# Patient Record
Sex: Female | Born: 1960
Health system: Southern US, Community
[De-identification: ages and names within clinical notes are randomized; demographics above are authoritative.]

## PROBLEM LIST (undated history)

## (undated) DIAGNOSIS — E039 Hypothyroidism, unspecified: Secondary | ICD-10-CM

## (undated) DIAGNOSIS — E559 Vitamin D deficiency, unspecified: Secondary | ICD-10-CM

## (undated) DIAGNOSIS — E785 Hyperlipidemia, unspecified: Secondary | ICD-10-CM

## (undated) DIAGNOSIS — J302 Other seasonal allergic rhinitis: Secondary | ICD-10-CM

## (undated) DIAGNOSIS — E2839 Other primary ovarian failure: Secondary | ICD-10-CM

## (undated) DIAGNOSIS — E079 Disorder of thyroid, unspecified: Secondary | ICD-10-CM

## (undated) HISTORY — DX: Other seasonal allergic rhinitis: J30.2

## (undated) HISTORY — DX: Hyperlipidemia, unspecified: E78.5

---
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
## 1986-03-05 HISTORY — PX: TMJ ARTHROPLASTY: SHX1066

## 1997-06-25 ENCOUNTER — Ambulatory Visit (HOSPITAL_COMMUNITY): Admission: RE | Admit: 1997-06-25 | Discharge: 1997-06-25 | Payer: Self-pay | Admitting: Obstetrics and Gynecology

## 1998-03-05 HISTORY — PX: ABDOMINAL HYSTERECTOMY: SHX81

## 1999-01-13 ENCOUNTER — Emergency Department (HOSPITAL_COMMUNITY): Admission: EM | Admit: 1999-01-13 | Discharge: 1999-01-13 | Payer: Self-pay | Admitting: Emergency Medicine

## 1999-01-24 ENCOUNTER — Ambulatory Visit (HOSPITAL_COMMUNITY): Admission: RE | Admit: 1999-01-24 | Discharge: 1999-01-24 | Payer: Self-pay | Admitting: Urology

## 1999-01-24 ENCOUNTER — Encounter: Payer: Self-pay | Admitting: Urology

## 1999-03-13 ENCOUNTER — Other Ambulatory Visit: Admission: RE | Admit: 1999-03-13 | Discharge: 1999-03-13 | Payer: Self-pay | Admitting: Obstetrics and Gynecology

## 2000-05-08 ENCOUNTER — Other Ambulatory Visit: Admission: RE | Admit: 2000-05-08 | Discharge: 2000-05-08 | Payer: Self-pay | Admitting: Obstetrics and Gynecology

## 2000-07-09 ENCOUNTER — Encounter (INDEPENDENT_AMBULATORY_CARE_PROVIDER_SITE_OTHER): Payer: Self-pay

## 2000-07-09 ENCOUNTER — Ambulatory Visit (HOSPITAL_COMMUNITY): Admission: RE | Admit: 2000-07-09 | Discharge: 2000-07-09 | Payer: Self-pay | Admitting: Obstetrics and Gynecology

## 2001-02-18 ENCOUNTER — Emergency Department (HOSPITAL_COMMUNITY): Admission: EM | Admit: 2001-02-18 | Discharge: 2001-02-18 | Payer: Self-pay | Admitting: Emergency Medicine

## 2001-10-15 ENCOUNTER — Other Ambulatory Visit: Admission: RE | Admit: 2001-10-15 | Discharge: 2001-10-15 | Payer: Self-pay | Admitting: Obstetrics and Gynecology

## 2001-10-20 ENCOUNTER — Encounter: Admission: RE | Admit: 2001-10-20 | Discharge: 2001-10-20 | Payer: Self-pay | Admitting: Obstetrics and Gynecology

## 2001-10-20 ENCOUNTER — Encounter: Payer: Self-pay | Admitting: Obstetrics and Gynecology

## 2002-07-28 ENCOUNTER — Encounter (INDEPENDENT_AMBULATORY_CARE_PROVIDER_SITE_OTHER): Payer: Self-pay | Admitting: *Deleted

## 2002-07-28 ENCOUNTER — Inpatient Hospital Stay (HOSPITAL_COMMUNITY): Admission: RE | Admit: 2002-07-28 | Discharge: 2002-07-30 | Payer: Self-pay | Admitting: Obstetrics and Gynecology

## 2003-09-01 ENCOUNTER — Other Ambulatory Visit: Admission: RE | Admit: 2003-09-01 | Discharge: 2003-09-01 | Payer: Self-pay | Admitting: Obstetrics and Gynecology

## 2003-10-29 ENCOUNTER — Emergency Department (HOSPITAL_COMMUNITY): Admission: EM | Admit: 2003-10-29 | Discharge: 2003-10-29 | Payer: Self-pay | Admitting: Emergency Medicine

## 2005-02-21 ENCOUNTER — Other Ambulatory Visit: Admission: RE | Admit: 2005-02-21 | Discharge: 2005-02-21 | Payer: Self-pay | Admitting: Obstetrics and Gynecology

## 2006-02-11 ENCOUNTER — Encounter: Admission: RE | Admit: 2006-02-11 | Discharge: 2006-02-11 | Payer: Self-pay | Admitting: Obstetrics and Gynecology

## 2006-04-05 ENCOUNTER — Encounter: Payer: Self-pay | Admitting: Cardiology

## 2006-04-05 ENCOUNTER — Ambulatory Visit (HOSPITAL_COMMUNITY): Admission: RE | Admit: 2006-04-05 | Discharge: 2006-04-05 | Payer: Self-pay | Admitting: Obstetrics and Gynecology

## 2007-04-30 ENCOUNTER — Encounter: Payer: Self-pay | Admitting: Cardiology

## 2008-08-27 ENCOUNTER — Encounter: Admission: RE | Admit: 2008-08-27 | Discharge: 2008-08-27 | Payer: Self-pay | Admitting: Obstetrics and Gynecology

## 2009-07-21 ENCOUNTER — Encounter: Payer: Self-pay | Admitting: Cardiology

## 2009-07-27 ENCOUNTER — Encounter: Payer: Self-pay | Admitting: Cardiology

## 2009-09-08 DIAGNOSIS — E785 Hyperlipidemia, unspecified: Secondary | ICD-10-CM

## 2009-09-13 ENCOUNTER — Ambulatory Visit: Payer: Self-pay | Admitting: Cardiology

## 2010-03-26 ENCOUNTER — Encounter: Payer: Self-pay | Admitting: Obstetrics and Gynecology

## 2010-04-04 NOTE — Assessment & Plan Note (Signed)
Summary: np6/hyperlipidema/jss   Visit Type:  new pt visit Referring Provider:  Dr. Henderson Cloud Primary Provider:  Dr. Henderson Cloud  CC:  pt referred to Dr. Daleen Squibb for hyperlipidemia...no cardiac complaints today.  History of Present Illness: Ms Rupert is a 50 year old white female referred today by Dr Henderson Cloud for progressive mixed hyperlipidemia.she  She currently denies any symptoms of angina or ischemia. She exercises on an inconsistent basis. Her risk factors for coronary disease include hyperlipidemia with her last total cholesterol being 259, triglycerides 187, HDL 55, LDL 167. Her total cholesterol to HDL ratio is 4.7. Looking back through her lipid panels today, which I have reviewed with her, there has been a progressive increase her total cholesterol, triglycerides, LDL, also her HDL. During this time frame, she has begun fish oil. She has also gained weight.  Current Medications (verified): 1)  Fish Oil Triple Strength 1400 Mg Caps (Omega-3 Fatty Acids) .Marland Kitchen.. 1 Cap Two Times A Day 2)  Multivitamins   Tabs (Multiple Vitamin) .Marland Kitchen.. 1 Tab Once Daily 3)  Aspirin 81 Mg Tbec (Aspirin) .... Take One Tablet By Mouth Daily 4)  Natural Psyllium Fiber 58.6 % Powd (Psyllium) .... Take As Directed  Allergies (verified): No Known Drug Allergies  Past History:  Past Medical History: Last updated: 09/08/2009 Mixed hyperlipidemia  Past Surgical History: Last updated: 09/08/2009 C-section x 2 TMJ surgery Oral surgery Hysterectomy  Family History: Last updated: 09/08/2009 Family History of Coronary Artery Disease:  Family History of Asthma Family History of IBS  Social History: Last updated: 09/08/2009 Divorced  Tobacco Use - No.  Alcohol Use - yes Drug Use - no  Risk Factors: Smoking Status: never (09/08/2009)  Review of Systems       negative other than history of present illness  Vital Signs:  Patient profile:   50 year old female Height:      63 inches Weight:      165  pounds BMI:     29.33 Pulse rate:   66 / minute Pulse rhythm:   regular BP sitting:   102 / 70  (left arm) Cuff size:   large  Vitals Entered By: Danielle Rankin, CMA (September 13, 2009 9:59 AM)  Physical Exam  General:  no acute distress, overweight Head:  normocephalic and atraumatic Eyes:  PERRLA/EOM intact; conjunctiva and lids normal. Neck:  Neck supple, no JVD. No masses, thyromegaly or abnormal cervical nodes. Chest Hamzeh Tall:  no deformities or breast masses noted Lungs:  Clear bilaterally to auscultation and percussion. Heart:  Non-displaced PMI, chest non-tender; regular rate and rhythm, S1, S2 without murmurs, rubs or gallops. Carotid upstroke normal, no bruit. Normal abdominal aortic size, no bruits. Femorals normal pulses, no bruits. Pedals normal pulses. No edema, no varicosities. Abdomen:  Bowel sounds positive; abdomen soft and non-tender without masses, organomegaly, or hernias noted. No hepatosplenomegaly. Msk:  Back normal, normal gait. Muscle strength and tone normal. Pulses:  pulses normal in all 4 extremities Extremities:  No clubbing or cyanosis. Neurologic:  Alert and oriented x 3. Skin:  Intact without lesions or rashes. Psych:  Normal affect.   EKG  Procedure date:  09/13/2009  Findings:      normal sinus rhythm, normal EKG  EKG  Procedure date:  09/13/2009  Findings:      normal sinus rhythm, normal EKG  Impression & Recommendations:  Problem # 1:  HYPERLIPIDEMIA-MIXED (ICD-272.4) Assessment Deteriorated I had a long talk with her today. She has a low risk of premature coronary disease or  vascular disease based on her risk analysis score. She is adverse to taking a statin. Her father had all kinds of problems with muscle aches and weakness.  After a long discussion, we have decided on a conservative approach. The fish oil has helped her HDL and probably kept her triglycerides down as well. We've advised her to continue this. In addition advise weight  reduction and 3 hours of cardio activity each week. We've also reviewed the symptoms of angina or ischemic heart disease. She has been advised how to respond to both exertional angina or acute coronary syndrome. I will see her back on a p.r.n. basis. If she would like to consider a statin down the road, I told her I be glad to advise her and initiate one. Current guidelines would not support this.  Other Orders: EKG w/ Interpretation (93000)  Patient Instructions: 1)  Your physician recommends that you schedule a follow-up appointment in: as needed 2)  Your physician recommends that you continue on your current medications as directed. Please refer to the Current Medication list given to you today.

## 2010-04-04 NOTE — Letter (Signed)
Summary: [facility]  [facility]   Imported By: Marylou Mccoy 09/29/2009 09:57:57  _____________________________________________________________________  External Attachment:    Type:   Image     Comment:   External Document

## 2010-07-21 NOTE — Op Note (Signed)
[facility]  Patient:    [name], [name]                      MRN: [id number] Proc. Date: 07/09/00 Adm. Date:  [id number] Attending:  Cordelia Pen Ii                           Operative Report  PREOPERATIVE DIAGNOSIS:         Menometrorrhagia.  POSTOPERATIVE DIAGNOSIS:        Endometrial polyp.  OPERATION:                      Hysteroscopy with resection of endometrial polyp and sharp curettage.  SURGEON:                        Guy Sandifer. Arleta Creek, M.D.  ANESTHESIA:                     General LMA.  ANESTHESIOLOGIST:               Octaviano Glow. Pamalee Leyden, M.D.  ESTIMATED BLOOD LOSS:           Less than 100 cc.  INPUT AND OUTPUT:               Sorbitol distending media, 60 cc deficit.  INDICATIONS AND CONSENT:        This patient is a 50 year old married white female, G2, P2, status post tubal ligation with menometrorrhagia.  Details are dictated in the history and physical.  Hysteroscopy with resectoscope and D&C is discussed with the patient.  Potential complications are discussed including but not limited to infection, uterine perforation, organ damage, bleeding requiring transfusion of blood products with possible transfusion reaction, HIV and hepatitis acquisition, DVT, PE and pneumonia.  All questions are answered and consent is signed on the chart.  FINDINGS:                       There is a single 2-3 cm polyp arising from the anterior superior wall of the endometrial cavity slightly to the right of midline.  No other abnormal structures are noted.  Fallopian tube ostia are identified bilaterally.  DESCRIPTION OF PROCEDURE:       The patient was taken to the operating room and placed in the dorsal supine position where general anesthesia is introduced via LMA.  She was then placed in the dorsal lithotomy position where she was prepped, bladder straight catheterized, and she was draped in a sterile fashion.  Examination revealed the  uterus to be anteverted, 8-10 weeks in size.  Bivalve speculum was placed in the vagina, and the anterior cervical lip was grasped with a single-tooth tenaculum.  The cervix was then gently and progressively dilated to a 35 Pratt dilator.  The resectoscope was then placed in the cervical canal and advanced under direct visualization using sorbitol distending media.  The above findings were noted.  The polyp is resected with the single right angle loop.  The polyp is totally resected.  No other abnormal structures were noted.  Hysteroscope was withdrawn.  Sharp curettage was carried out, and these curettings were sent separately to pathology.  Good hemostasis is noted.  All instruments are removed.  The patient was awakened and taken to the recovery room in stable condition. DD:  07/09/00 TD:  07/09/00 Job: [id number]

## 2010-07-21 NOTE — Op Note (Signed)
[name], [name]                         ACCOUNT NO.:  [id number]   MEDICAL RECORD NO.:  [id number]                   PATIENT TYPE:  INP   LOCATION:  9305                                 FACILITY:  [facility]   PHYSICIAN:  Guy Sandifer. Arleta Creek, M.D.           DATE OF BIRTH:  1960/05/19   DATE OF PROCEDURE:  07/28/2002  DATE OF DISCHARGE:                                 OPERATIVE REPORT   PREOPERATIVE DIAGNOSIS:  Uterine leiomyomata.   POSTOPERATIVE DIAGNOSIS:  Leiomyomata.   PROCEDURE:  Total abdominal hysterectomy.   SURGEON:  Guy Sandifer. Henderson Cloud, M.D.   ASSISTANTFreddy Finner, M.D.   ANESTHESIA:  General with endotracheal intubation.   ESTIMATED BLOOD LOSS:  200 mL.   SPECIMENS:  Uterus, 272 g.   INDICATIONS AND CONSENT:  The patient is a 50 year old married white female,  G2, P2, status post tubal ligation, with enlarging symptomatic uterine  leiomyomata.  Details dictated in the history and physical.  Total abdominal  hysterectomy and removal of one tube or ovary only if distinctly abnormal is  discussed.  Potential risks and complications have been reviewed  preoperatively, including but not limited to infection, bowel, bladder, or  ureteral damage, bleeding requiring transfusion of blood products with  possible HIV and hepatitis acquisition, DVT, PE, pneumonia, fistula  formation, and postoperative dyspareunia.  All questions have been answered,  and consent is signed on the chart.   FINDINGS:  The uterus is approximately 10 weeks in size and distorted with  multiple intramural uterine leiomyomata.  Tubes and ovaries are normal  bilaterally, status post tubal ligation with Filshie clips bilaterally.  There is some scarring with the vesicouterine peritoneum adherent to the  lower anterior uterine segment.  Posterior cul-de-sac is normal.   DESCRIPTION OF PROCEDURE:  The patient is taken to the operating room and  placed in the dorsal supine position, where general  anesthesia is induced  via endotracheal intubation.  She is then prepped abdominally and vaginally,  a Foley catheter is placed, and the bladder is drained and she is draped in  a sterile fashion.  The skin is entered through the previous Pfannenstiel  scar, and dissection is carried out in layers to the peritoneum.  The  peritoneum is entered and extended superiorly and inferiorly.  The Lenox Ahr retractor is placed, the bladder blade is placed, and the bowel  is packed away and the upper blade is placed.  Careful inspection reveals no  undue pressure on the pelvic rim with the retracting blades.  Kelly clamps  are placed on the proximal ligaments bilaterally to elevate the uterus.  The  left round ligament is ligated with 0 Monocryl suture.  All suture will be 0  Monocryl unless otherwise designated.  Round ligaments then taken down and  the anterior leaf of the broad ligament is taken down sharply.  Posteriorly  this is bluntly perforated.  The proximal ligaments are then taken down and  doubly ligated, first with a free tie, then with a suture.  The bladder is  advanced sufficiently and the left uterine vessels are skeletonized.  The  uterine vessels are then taken down and singly ligated.  A similar procedure  is carried out on the right side.  The adhesions of the vesicouterine  peritoneum on the anterior fundus are taken down sharply and then bluntly  without difficulty.  The right uterine vessels are taken down as well.  The  cardinal ligament is then taken down with successive bites with a straight  Heaney clamp.  A curved clamp is placed around the right vaginal fornix and  uterosacral ligament.  This pedicle successfully enters the vagina.  This is  ligated with a transfixion suture and held.  A similar procedure is carried  out on the left side.  The specimen is then cut free from the vagina and the  inspection reveals the total cervix has been removed.  The cuff is  then  closed with figure-of-eights.  The uterosacral ligaments are ligated in the  midline with a separate suture.  Copious irrigation is carried out and  several small bleeders on the peritoneal edges are controlled with cautery.  The right ovary has been, in fact, suspended from the right round ligament  during the initial dissection.  The left ovary is suspended from the left  round ligament with a separate suture.  Reinspection reveals good hemostasis  all around.  The packs are removed.  The anterior peritoneum was closed in a  running fashion with 0 Monocryl suture, which is also used to reapproximate  the rectus and pyramidalis muscles in the midline.  The anterior rectus  fascia is closed in a running fashion starting at each angle and meeting in  the middle with 0 PDS suture.  Skin is closed with clips.  All sponge,  instrument, and needle counts are correct.  The patient is awakened and  taken to the recovery room in stable condition.                                               Guy Sandifer Arleta Creek, M.D.    JET/MEDQ  D:  07/28/2002  T:  07/28/2002  Job:  [id number]

## 2010-07-21 NOTE — H&P (Signed)
[facility]  Patient:    [name], [name]                        MRN: [id number] Adm. Date:  07/09/00 Attending:  Guy Sandifer. Arleta Creek, M.D.                         History and Physical  CHIEF COMPLAINT:                Menometrorrhagia.  HISTORY OF PRESENT ILLNESS:     This patient is a 49 year old married white female G2, P2, status post tubal ligation with menstrual cycles essentially every two weeks.  On physical examination, uterus is 8-10 weeks in size which is a slight growth over the past year.  On ultrasound, the uterus measures 9.2 x 5 x 5.7 cm with at least two leiomyomata present, the largest being 3.3 cm. The ovaries are normal.  On sonohysterogram there is a 2.3 cm polypoid type structure projecting into the endometrial cavity.  After discussion of options and management, hysteroscopy with resectoscope and D&C is discussed.  All questions have been answered.  PAST MEDICAL HISTORY:           None.  PAST SURGICAL HISTORY:          1. Appendectomy in 1977.                                 2. Temporomandibular joint surgery.                                 3. Tubal ligation 1999.  OBSTETRICAL HISTORY:            Cesarean section x 2.  FAMILY HISTORY:                 Diabetes in first cousin.  Coronary artery disease in paternal grandfather and maternal grandfather.  MEDICATIONS:                    Xanax 0.25 mg p.o. b.i.d. p.r.n.  ALLERGIES:                      No known drug allergies.  SOCIAL HISTORY:                 The patient denies tobacco, alcohol or drug abuse.  PHYSICAL EXAMINATION:  VITAL SIGNS:                    Height 5 feet 3 inches, weight 148 pounds, blood pressure 96/64.  HEENT:                          Without thyromegaly.  LUNGS:                          Clear to auscultation.  HEART:                          Regular rate and rhythm.  BACK:                           Without CVA tenderness.  BREASTS:  No masses, retractions or discharge.  ABDOMEN:                        Soft, nontender without masses.  PELVIC:                         Vulva, vagina and cervix without lesions.  The uterus is anteverted 8-10 weeks in size, irregular in contour, nontender. Adnexa nontender without masses.  EXTREMITIES:                    Grossly within normal limits.  NEUROLOGICAL:                   Grossly within normal limits.  ASSESSMENT:                     Menometrorrhagia.  PLAN:                           Hysteroscopy with D&C. DD:  07/04/00 TD:  07/04/00 Job: [id number]

## 2010-07-21 NOTE — Discharge Summary (Signed)
   NAMEROCHELLE, LARUE                         ACCOUNT NO.:  [id number]   MEDICAL RECORD NO.:  [id number]                   PATIENT TYPE:  INP   LOCATION:  9305                                 FACILITY:  [facility]   PHYSICIAN:  Guy Sandifer. Arleta Creek, M.D.           DATE OF BIRTH:  August 26, 1960   DATE OF ADMISSION:  07/28/2002  DATE OF DISCHARGE:  07/30/2002                                 DISCHARGE SUMMARY   ADMISSION DIAGNOSIS:  Uterine myomata   DISCHARGE DIAGNOSIS:  Uterine myomata.   PROCEDURE:  On 07/28/2002, total abdominal hysterectomy.   REASON FOR ADMISSION:  This patient is a 50 year old married white female,  G2, P2, status post tubal ligation with increasing symptomatic uterine  myomata.  Details are dictated in History and Physical.   HOSPITAL COURSE:  The patient was admitted to the hospital and underwent the  above procedure.  Estimated blood loss is 200 ml.  On the evening of  surgery, she has good pain relief.  She had some nausea when she dangling  her legs but okay when in the bed.  Vital signs are stable.  She is afebrile  with good urine output.   On the first postoperative day, she is not yet passing flatus and having  some low back pain.  She is voiding well and up in a chair.  She remains  afebrile with stable vital signs.  Abdomen is soft with positive bowel  sounds.  White count 7.6, hemoglobin 11.4.   On the day of discharge, she is passing flatus after a Dulcolax suppository.  Tolerating a regular diet, ambulating, and voiding without problems.   CONDITION ON DISCHARGE:  Good.   DIET:  Regular as tolerated.   ACTIVITY:  No lifting, operation of an automobile,  no vaginal entry.   FOLLOW UP:  She is to call the office for problems including but not limited  to temperature of 101 or greater, heavy vaginal bleeding, increasing pain,  persistent nausea or vomiting.  Follow up in the office in two weeks.   DISCHARGE MEDICATIONS:  1. Ibuprofen 600 mg p.o.  q.6h. p.r.n.  2. Percocet 5/325 mg, #30, 1 to 2 p.o. q.6h. p.r.n.  3. Multivitamins daily.                                               Guy Sandifer Arleta Creek, M.D.    JET/MEDQ  D:  07/30/2002  T:  07/30/2002  Job:  [id number]

## 2010-07-21 NOTE — H&P (Signed)
[name], [name]                         ACCOUNT NO.:  [id number]   MEDICAL RECORD NO.:  [id number]                   PATIENT TYPE:  INP   LOCATION:  NA                                   [facility]   PHYSICIAN:  Guy Sandifer. Arleta Creek, M.D.           DATE OF BIRTH:  1961-02-28   DATE OF ADMISSION:  07/28/2002  DATE OF DISCHARGE:                                HISTORY & PHYSICAL   CHIEF COMPLAINT:  Uterine fibroids.   HISTORY OF PRESENT ILLNESS:  This patient is a 50 year old, married, white  female, G2, P2, status post tubal ligation with enlarging symptomatic  uterine myelomata.  An ultrasound on Jul 08, 2002, confirms growth of the  uterine leiomyomata.  The uterus measures 12 x 8 x 6.5 cm and there are  multiple leiomyomata ranging from 2-4.5 cm.  The ovaries appeared normal.  After discussion of the options, the patient is being admitted for total  abdominal hysterectomy and removal of one tube and ovary only if distinctly  abnormal.   PAST MEDICAL HISTORY:  Negative.   PAST SURGICAL HISTORY:  1. Appendectomy in 1977.  2. TMJ surgery.  3. Tubal ligation in 1994.  4. Hysteroscopy and D&C for benign endometrial polyp in 2002.   OBSTETRICAL HISTORY:  Cesarean section x 2.   FAMILY HISTORY:  Diabetes in first cousin.  Coronary artery disease in  paternal grandfather and maternal grandfather.   MEDICATIONS:  1. Multivitamins daily.  2. Seasonal allergy medicine p.r.n.   ALLERGIES:  No known drug allergies.   SOCIAL HISTORY:  The patient denies tobacco, alcohol, or drug abuse.   REVIEW OF SYSTEMS:  NEUROLOGIC:  Denies headaches.  PULMONARY:  Denies  shortness of breath or cough.  CARDIOVASCULAR:  Denies chest pain.  GASTROINTESTINAL:  Denies abdominal pain.  MUSCULOSKELETAL:  Denies joint  pains.   PHYSICAL EXAMINATION:  HEIGHT:  5 feet 3 inches.  WEIGHT:  161 pounds.  VITAL SIGNS:  Blood pressure 100/66.  NECK:  Without thyromegaly.  LUNGS:  Clear to  auscultation.  HEART:  Regular rate and rhythm.  BACK:  Without CVA tenderness.  BREASTS:  With fibrocystic change bilaterally.  ABDOMEN:  Soft and nontender without masses.  PELVIC:  Vulva, vagina, and cervix without lesions.  The uterus is about 10  weeks in size, irregular in contour, mobile, and mildly tender.  Adnexal  exam compromised.  EXTREMITIES:  Grossly within normal limits.  NEUROLOGIC:  Grossly within normal limits.   ASSESSMENT:  Symptomatic uterine leiomyomata.   PLAN:  1. Total abdominal hysterectomy.  2. Removal of one tube and ovary only if abnormal.                                               Guy Sandifer. Tomblin II,  M.D.    JET/MEDQ  D:  07/27/2002  T:  07/27/2002  Job:  [id number]

## 2011-01-31 ENCOUNTER — Other Ambulatory Visit: Payer: Self-pay | Admitting: Obstetrics and Gynecology

## 2011-01-31 DIAGNOSIS — N63 Unspecified lump in unspecified breast: Secondary | ICD-10-CM

## 2011-02-02 ENCOUNTER — Ambulatory Visit
Admission: RE | Admit: 2011-02-02 | Discharge: 2011-02-02 | Disposition: A | Discharge status: Home / Self Care | Payer: 59 | Source: Ambulatory Visit | Attending: Obstetrics and Gynecology | Admitting: Obstetrics and Gynecology

## 2011-02-02 ENCOUNTER — Other Ambulatory Visit: Payer: Self-pay | Admitting: Obstetrics and Gynecology

## 2011-02-02 DIAGNOSIS — N63 Unspecified lump in unspecified breast: Secondary | ICD-10-CM

## 2011-02-14 ENCOUNTER — Other Ambulatory Visit: Payer: Self-pay

## 2011-06-10 ENCOUNTER — Ambulatory Visit (INDEPENDENT_AMBULATORY_CARE_PROVIDER_SITE_OTHER): Payer: 59 | Admitting: Family Medicine

## 2011-06-10 VITALS — BP 112/73 | HR 67 | Temp 98.6°F | Resp 16 | Ht 63.5 in | Wt 170.2 lb

## 2011-06-10 DIAGNOSIS — J3489 Other specified disorders of nose and nasal sinuses: Secondary | ICD-10-CM

## 2011-06-10 DIAGNOSIS — M436 Torticollis: Secondary | ICD-10-CM

## 2011-06-10 DIAGNOSIS — R0981 Nasal congestion: Secondary | ICD-10-CM

## 2011-06-10 MED ORDER — PREDNISONE 20 MG PO TABS: ORAL_TABLET | ORAL | Status: DC

## 2011-06-10 MED ORDER — CYCLOBENZAPRINE HCL 5 MG PO TABS: ORAL_TABLET | ORAL | Status: DC

## 2011-06-10 NOTE — Progress Notes (Signed)
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  No weakness, numbness.  Pain with rotating head.   Also notes some sinus congestion lately.  No definite h/o seasonal allergies.  Uses Nettipot  O:  Alert, being careful not to move neck freely Tender at origin and insertion of the levator scapula muscle Decreased ROM of neck with respect to rotation limitation secondary to pain Reflexes in arms normal, motor and sensory also normal HEENT:  Mild nasal congestion and swelling Chest:  Clear Heart:  Reg, no murmur  A:  Torticollis, mild sinus congestion  P:  Prednisone 20 2 daily x 3, flexeril 5 at hs.

## 2011-06-10 NOTE — Patient Instructions (Signed)
Torticollis, Acute You have suddenly (acutely) developed a twisted neck (torticollis). This is usually a self-limited condition. CAUSES  Acute torticollis may be caused by malposition, trauma or infection. Most commonly, acute torticollis is caused by sleeping in an awkward position. Torticollis may also be caused by the flexion, extension or twisting of the neck muscles beyond their normal position. Sometimes, the exact cause may not be known. SYMPTOMS  Usually, there is pain and limited movement of the neck. Your neck may twist to one side. DIAGNOSIS  The diagnosis is often made by physical examination. X-rays, CT scans or MRIs may be done if there is a history of trauma or concern of infection. TREATMENT  For a common, stiff neck that develops during sleep, treatment is focused on relaxing the contracted neck muscle. Medications (including shots) may be used to treat the problem. Most cases resolve in several days. Torticollis usually responds to conservative physical therapy. If left untreated, the shortened and spastic neck muscle can cause deformities in the face and neck. Rarely, surgery is required. HOME CARE INSTRUCTIONS   Use over-the-counter and prescription medications as directed by your caregiver.   Do stretching exercises and massage the neck as directed by your caregiver.   Follow up with physical therapy if needed and as directed by your caregiver.  SEEK IMMEDIATE MEDICAL CARE IF:   You develop difficulty breathing or noisy breathing (stridor).   You drool, develop trouble swallowing or have pain with swallowing.   You develop numbness or weakness in the hands or feet.   You have changes in speech or vision.   You have problems with urination or bowel movements.   You have difficulty walking.   You have a fever.   You have increased pain.  MAKE SURE YOU:   Understand these instructions.   Will watch your condition.   Will get help right away if you are not  doing well or get worse.  Document Released: 02/17/2000 Document Revised: 02/08/2011 Document Reviewed: 03/30/2009 [employer] Patient Information 2012 [employer]. 

## 2011-07-26 ENCOUNTER — Encounter: Payer: Self-pay | Admitting: Gastroenterology

## 2011-08-20 ENCOUNTER — Encounter: Payer: 59 | Admitting: Gastroenterology

## 2011-08-30 ENCOUNTER — Other Ambulatory Visit: Payer: Self-pay | Admitting: Gastroenterology

## 2011-11-29 ENCOUNTER — Ambulatory Visit: Payer: 59 | Admitting: Dietician

## 2011-12-05 ENCOUNTER — Encounter: Payer: 59 | Attending: Obstetrics and Gynecology | Admitting: *Deleted

## 2011-12-05 VITALS — Ht 63.0 in | Wt 170.8 lb

## 2011-12-05 DIAGNOSIS — E785 Hyperlipidemia, unspecified: Secondary | ICD-10-CM | POA: Insufficient documentation

## 2011-12-05 DIAGNOSIS — Z713 Dietary counseling and surveillance: Secondary | ICD-10-CM | POA: Insufficient documentation

## 2011-12-05 NOTE — Progress Notes (Signed)
  Medical Nutrition Therapy:  Appt start time: 1130 end time:  1230.   Assessment:  Primary concerns today: high cholesterol and weight gain.   MEDICATIONS: none   DIETARY INTAKE:  Usual eating pattern includes 3 meals and 1-3 snacks per day.  Everyday foods include fruits, vegetables, some high cholesterol proteins and dairy products.  Avoided foods include none.    24-hr recall:  B ( AM): egg or egg and egg white with slice bacon or toast; oatmeal or cereal sometimes (at home) with glass 2% milk or yogurt  Snk ( AM): may eat yogurt if not at breakfast  L ( PM): leftovers or sandwich (meat, starch, veggie) Snk ( PM): almonds or popcorn sometimes.  Rarely eats chips.  May have fruit D ( PM): sandwich or salmon with asparagus.  May get chick fil a sometimes Snk ( PM): generally not.  May have chi seeds with milk or yogurt Beverages: water; occasionally OJ; coffee black  Usual physical activity: runs 9 miles/week  Estimated energy needs: 1500-1600 calories 170 g carbohydrates 112 g protein 42 g fat  Progress Towards Goal(s):  In progress.   Nutritional Diagnosis:  NI-5.6.2 Excessive fat intake As related to high fat breakfast meats consumed daily and limted vegetables.  As evidenced by high cholesterol and obesity.    Intervention:  Nutrition counseling provided.  Kalli is here for high cholesterol education and she would also like to loose weight.  She is active 3 days/week, but not on the other days.  Encouraged moderated activity on her "off days" such as walking, taking the stairs, parking farther away, etc.  Also suggested weight resistance training to preserve LBM and increase metabolic rate.  Discussed saturated, trans, and unsaturated fats and encouraged more oils versus solid fats.  Suggested 1% milk, egg substitutes, and limiting bacon.  Encouraged whole grains, frutis, and vegetables.  Discussed MyPlate for meal planning and portion control.   Handouts given during  visit include:  Reading food labels, low sodium seasoning, tips to reduce cholesterol and triglycerides  Pocket portion guide  Monitoring/Evaluation:  Dietary intake, exercise, and body weight prn.

## 2012-02-12 ENCOUNTER — Ambulatory Visit (INDEPENDENT_AMBULATORY_CARE_PROVIDER_SITE_OTHER): Payer: 59 | Admitting: Internal Medicine

## 2012-02-12 ENCOUNTER — Encounter: Payer: Self-pay | Admitting: Internal Medicine

## 2012-02-12 VITALS — BP 112/70 | HR 68 | Temp 98.3°F | Ht 63.0 in | Wt 164.8 lb

## 2012-02-12 DIAGNOSIS — J309 Allergic rhinitis, unspecified: Secondary | ICD-10-CM

## 2012-02-12 DIAGNOSIS — J302 Other seasonal allergic rhinitis: Secondary | ICD-10-CM

## 2012-02-12 DIAGNOSIS — E663 Overweight: Secondary | ICD-10-CM

## 2012-02-12 DIAGNOSIS — E785 Hyperlipidemia, unspecified: Secondary | ICD-10-CM

## 2012-02-12 NOTE — Progress Notes (Signed)
Subjective:    Patient ID: [name], female    DOB: 1960-04-29, 51 y.o.   MRN: [id number]  HPI Here to establish care - new pt to our division, seen by cards for consultation in 2011 Reviewed chronic medical issues:  Dyslipidemia - FH same with dad - working on diet/exercise control of same - never on rx med treatment - no CAD or CVA hx - FH or personal -   Seasonal allergies - fall predominate -the patient reports compliance with medication(s) as needed: OTC antihistamines. Denies adverse side effects. No current flare or symptoms  Weight management - working on diet/exercise efforts - running 3d/wk at present and following low fat/low chol diet efforts - intentional loss 5# over 2013 with these efforts - no diet supplements of weight loss rx  - no DM, OA, OSA or hypertension history  Past Medical History  Diagnosis Date  . Allergic rhinitis, seasonal   . Hyperlipidemia    Family History  Problem Relation Age of Onset  . Hypertension Father   . Hyperlipidemia Father   . Melanoma Mother 44  . Dementia Maternal Grandmother 70   History  Substance Use Topics  . Smoking status: Never Smoker   . 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 Alcohol Use: Yes    Review of Systems Constitutional: Negative for fever.  Respiratory: Negative for cough and shortness of breath.   Cardiovascular: Negative for chest pain or palpitations.  Gastrointestinal: Negative for abdominal pain, no bowel changes.       Objective:   Physical Exam BP 112/70  Pulse 68  Temp 98.3 F (36.8 C) (Oral)  Ht 5\' 3"  (1.6 m)  Wt 164 lb 12.8 oz (74.753 kg)  BMI 29.19 kg/m2  SpO2 98% Wt Readings from Last 3 Encounters:  02/12/12 164 lb 12.8 oz (74.753 kg)  12/05/11 170 lb 12.8 oz (77.474 kg)  06/10/11 170 lb 3.2 oz (77.202 kg)   Constitutional: She appears well-developed and well-nourished. No distress.  HENT:  Head: Normocephalic and atraumatic. Ears: B TMs ok, no erythema or effusion; Nose: Nose normal. Mouth/Throat: Oropharynx is clear and moist. No oropharyngeal exudate.  Eyes: Conjunctivae and EOM are normal. Pupils are equal, round, and reactive to light. No scleral icterus.  Neck: Normal range of motion. Neck supple. No JVD present. No thyromegaly present.  Cardiovascular: Normal rate, regular rhythm and normal heart sounds.  No murmur heard. No BLE edema. Pulmonary/Chest: Effort normal and breath sounds normal. No respiratory distress. She has no wheezes.  Abdominal: Soft. Bowel sounds are normal. She exhibits no distension. There is no tenderness. no masses Musculoskeletal: Normal range of motion, no joint effusions. No gross deformities Neurological: She is alert and oriented to person, place, and time. No cranial nerve deficit. Coordination normal.  Skin: Skin is warm and dry. No rash noted. No erythema.  Psychiatric: She has a normal mood and affect. Her behavior is normal. Judgment and thought content normal.   No results found for this basename: WBC, HGB, HCT, PLT, GLUCOSE, CHOL, TRIG, HDL, LDLDIRECT, LDLCALC, ALT, AST, NA, K, CL, CREATININE, BUN, CO2, TSH, PSA, INR, GLUF, HGBA1C, MICROALBUR        Assessment & Plan:   See problem list. Medications and labs reviewed today.  Time spent with pt today 35 minutes, greater than 50% time spent counseling patient on lipids, weight management, and medication  review. Also review of available prior records and need for ROI

## 2012-02-12 NOTE — Patient Instructions (Signed)
It was good to see you today. We have reviewed your prior records including labs and tests today Medications reviewed, no changes at this time. we will send to your prior provider(s) for "release of records" as discussed today -  Continue to work on lifestyle changes as discussed (low fat, low carb, increased protein diet; ongoing exercise efforts; weight loss) to control sugar, blood pressure and cholesterol levels and/or reduce risk of developing other medical problems. Look into [email or web address] or other type of food journal to assist you in this process if needed. Please schedule followup in 4-6 months for medical physical and labs, call sooner if problems.

## 2012-02-13 ENCOUNTER — Encounter: Payer: Self-pay | Admitting: Internal Medicine

## 2012-02-13 DIAGNOSIS — J302 Other seasonal allergic rhinitis: Secondary | ICD-10-CM | POA: Insufficient documentation

## 2012-02-13 DIAGNOSIS — E663 Overweight: Secondary | ICD-10-CM | POA: Insufficient documentation

## 2012-02-13 NOTE — Assessment & Plan Note (Signed)
Wt Readings from Last 3 Encounters:  02/12/12 164 lb 12.8 oz (74.753 kg)  12/05/11 170 lb 12.8 oz (77.474 kg)  06/10/11 170 lb 3.2 oz (77.202 kg)   Weight trends reviewed The patient is asked to make ongoing efforts to improve diet and exercise patterns to aid in medical management of this problem.

## 2012-02-13 NOTE — Assessment & Plan Note (Signed)
Hx same with elevated LDL but good ratio total/HDL Labs done semi annual at gyn - will send for ROI to review Prior eval with cards in 2011 for same also reviewed - recommended diet/exercise for primary prevention, no meds The patient is asked to make an ongoing attempt to improve diet and exercise patterns to aid in medical management of this problem.  Check lipids in spring when due for CPX Continue omega 3 supplements and low fat/low chol diet

## 2012-02-13 NOTE — Assessment & Plan Note (Signed)
OTC meds prn, no active symptoms or flare The current medical regimen is effective;  continue present plan and medications.

## 2012-02-21 ENCOUNTER — Encounter: Payer: Self-pay | Admitting: Internal Medicine

## 2012-04-05 LAB — HM MAMMOGRAPHY

## 2012-06-10 ENCOUNTER — Encounter: Payer: Self-pay | Admitting: Internal Medicine

## 2012-06-10 DIAGNOSIS — Z0000 Encounter for general adult medical examination without abnormal findings: Secondary | ICD-10-CM

## 2012-07-10 ENCOUNTER — Other Ambulatory Visit (INDEPENDENT_AMBULATORY_CARE_PROVIDER_SITE_OTHER): Payer: 59

## 2012-07-10 DIAGNOSIS — Z0000 Encounter for general adult medical examination without abnormal findings: Secondary | ICD-10-CM

## 2012-07-10 LAB — CBC WITH DIFFERENTIAL/PLATELET
Basophils Absolute: 0 10*3/uL (ref 0.0–0.1)
Eosinophils Absolute: 0.2 10*3/uL (ref 0.0–0.7)
MCHC: 34.9 g/dL (ref 30.0–36.0)
MCV: 88.8 fl (ref 78.0–100.0)
Monocytes Absolute: 0.4 10*3/uL (ref 0.1–1.0)
Neutrophils Relative %: 42.3 % — ABNORMAL LOW (ref 43.0–77.0)
Platelets: 149 10*3/uL — ABNORMAL LOW (ref 150.0–400.0)
WBC: 5.4 10*3/uL (ref 4.5–10.5)

## 2012-07-10 LAB — URINALYSIS, ROUTINE W REFLEX MICROSCOPIC
Nitrite: NEGATIVE
Urobilinogen, UA: 0.2 (ref 0.0–1.0)

## 2012-07-10 LAB — BASIC METABOLIC PANEL
BUN: 13 mg/dL (ref 6–23)
CO2: 26 mEq/L (ref 19–32)
Chloride: 103 mEq/L (ref 96–112)
Creatinine, Ser: 0.8 mg/dL (ref 0.4–1.2)

## 2012-07-10 LAB — HEPATIC FUNCTION PANEL
AST: 22 U/L (ref 0–37)
Alkaline Phosphatase: 49 U/L (ref 39–117)
Bilirubin, Direct: 0.1 mg/dL (ref 0.0–0.3)
Total Protein: 6.5 g/dL (ref 6.0–8.3)

## 2012-07-10 LAB — LIPID PANEL: VLDL: 16.8 mg/dL (ref 0.0–40.0)

## 2012-07-10 LAB — TSH: TSH: 1.29 u[IU]/mL (ref 0.35–5.50)

## 2012-07-16 ENCOUNTER — Encounter: Payer: Self-pay | Admitting: Internal Medicine

## 2012-07-16 ENCOUNTER — Ambulatory Visit (INDEPENDENT_AMBULATORY_CARE_PROVIDER_SITE_OTHER): Payer: 59 | Admitting: Internal Medicine

## 2012-07-16 VITALS — BP 110/70 | HR 59 | Temp 98.0°F | Wt 159.8 lb

## 2012-07-16 DIAGNOSIS — Z0000 Encounter for general adult medical examination without abnormal findings: Secondary | ICD-10-CM

## 2012-07-16 NOTE — Patient Instructions (Signed)
It was good to see you today. We have reviewed your prior records including labs and tests today Health Maintenance reviewed - all recommended immunizations and age-appropriate screenings are up-to-date. Medications reviewed and updated, no changes recommended at this time. Please schedule followup in 12 months, call sooner if problems. Continue to work on lifestyle changes as discussed (low carb, increased protein diet; improved exercise efforts; weight loss) to control sugar, blood pressure and cholesterol levels and/or reduce risk of developing other medical problems. Look into [email or web address] or other type of food journal to assist you in this process.  Health Maintenance, Females A healthy lifestyle and preventative care can promote health and wellness.  Maintain regular health, dental, and eye exams.  Eat a healthy diet. Foods like vegetables, fruits, whole grains, low-fat dairy products, and lean protein foods contain the nutrients you need without too many calories. Decrease your intake of foods high in solid fats, added sugars, and salt. Get information about a proper diet from your caregiver, if necessary.  Regular physical exercise is one of the most important things you can do for your health. Most adults should get at least 150 minutes of moderate-intensity exercise (any activity that increases your heart rate and causes you to sweat) each week. In addition, most adults need muscle-strengthening exercises on 2 or more days a week.   Maintain a healthy weight. The body mass index (BMI) is a screening tool to identify possible weight problems. It provides an estimate of body fat based on height and weight. Your caregiver can help determine your BMI, and can help you achieve or maintain a healthy weight. For adults 20 years and older:  A BMI below 18.5 is considered underweight.  A BMI of 18.5 to 24.9 is normal.  A BMI of 25 to 29.9 is considered overweight.  A BMI of 30 and above  is considered obese.  Maintain normal blood lipids and cholesterol by exercising and minimizing your intake of saturated fat. Eat a balanced diet with plenty of fruits and vegetables. Blood tests for lipids and cholesterol should begin at age 31 and be repeated every 5 years. If your lipid or cholesterol levels are high, you are over 50, or you are a high risk for heart disease, you may need your cholesterol levels checked more frequently.Ongoing high lipid and cholesterol levels should be treated with medicines if diet and exercise are not effective.  If you smoke, find out from your caregiver how to quit. If you do not use tobacco, do not start.  If you are pregnant, do not drink alcohol. If you are breastfeeding, be very cautious about drinking alcohol. If you are not pregnant and choose to drink alcohol, do not exceed 1 drink per day. One drink is considered to be 12 ounces (355 mL) of beer, 5 ounces (148 mL) of wine, or 1.5 ounces (44 mL) of liquor.  Avoid use of street drugs. Do not share needles with anyone. Ask for help if you need support or instructions about stopping the use of drugs.  High blood pressure causes heart disease and increases the risk of stroke. Blood pressure should be checked at least every 1 to 2 years. Ongoing high blood pressure should be treated with medicines, if weight loss and exercise are not effective.  If you are 28 to 52 years old, ask your caregiver if you should take aspirin to prevent strokes.  Diabetes screening involves taking a blood sample to check your fasting blood sugar level. This should  be done once every 3 years, after age 61, if you are within normal weight and without risk factors for diabetes. Testing should be considered at a younger age or be carried out more frequently if you are overweight and have at least 1 risk factor for diabetes.  Breast cancer screening is essential preventative care for women. You should practice "breast  self-awareness." This means understanding the normal appearance and feel of your breasts and may include breast self-examination. Any changes detected, no matter how small, should be reported to a caregiver. Women in their 72s and 30s should have a clinical breast exam (CBE) by a caregiver as part of a regular health exam every 1 to 3 years. After age 46, women should have a CBE every year. Starting at age 72, women should consider having a mammogram (breast X-ray) every year. Women who have a family history of breast cancer should talk to their caregiver about genetic screening. Women at a high risk of breast cancer should talk to their caregiver about having an MRI and a mammogram every year.  The Pap test is a screening test for cervical cancer. Women should have a Pap test starting at age 65. Between ages 37 and 87, Pap tests should be repeated every 2 years. Beginning at age 62, you should have a Pap test every 3 years as long as the past 3 Pap tests have been normal. If you had a hysterectomy for a problem that was not cancer or a condition that could lead to cancer, then you no longer need Pap tests. If you are between ages 82 and 68, and you have had normal Pap tests going back 10 years, you no longer need Pap tests. If you have had past treatment for cervical cancer or a condition that could lead to cancer, you need Pap tests and screening for cancer for at least 20 years after your treatment. If Pap tests have been discontinued, risk factors (such as a new sexual partner) need to be reassessed to determine if screening should be resumed. Some women have medical problems that increase the chance of getting cervical cancer. In these cases, your caregiver may recommend more frequent screening and Pap tests.  The human papillomavirus (HPV) test is an additional test that may be used for cervical cancer screening. The HPV test looks for the virus that can cause the cell changes on the cervix. The cells  collected during the Pap test can be tested for HPV. The HPV test could be used to screen women aged 72 years and older, and should be used in women of any age who have unclear Pap test results. After the age of 88, women should have HPV testing at the same frequency as a Pap test.  Colorectal cancer can be detected and often prevented. Most routine colorectal cancer screening begins at the age of 45 and continues through age 92. However, your caregiver may recommend screening at an earlier age if you have risk factors for colon cancer. On a yearly basis, your caregiver may provide home test kits to check for hidden blood in the stool. Use of a small camera at the end of a tube, to directly examine the colon (sigmoidoscopy or colonoscopy), can detect the earliest forms of colorectal cancer. Talk to your caregiver about this at age 34, when routine screening begins. Direct examination of the colon should be repeated every 5 to 10 years through age 63, unless early forms of pre-cancerous polyps or small growths are  found.  Hepatitis C blood testing is recommended for all people born from [birth year] through 1965 and any individual with known risks for hepatitis C.  Practice safe sex. Use condoms and avoid high-risk sexual practices to reduce the spread of sexually transmitted infections (STIs). Sexually active women aged 27 and younger should be checked for Chlamydia, which is a common sexually transmitted infection. Older women with new or multiple partners should also be tested for Chlamydia. Testing for other STIs is recommended if you are sexually active and at increased risk.  Osteoporosis is a disease in which the bones lose minerals and strength with aging. This can result in serious bone fractures. The risk of osteoporosis can be identified using a bone density scan. Women ages 11 and over and women at risk for fractures or osteoporosis should discuss screening with their caregivers. Ask your caregiver  whether you should be taking a calcium supplement or vitamin D to reduce the rate of osteoporosis.  Menopause can be associated with physical symptoms and risks. Hormone replacement therapy is available to decrease symptoms and risks. You should talk to your caregiver about whether hormone replacement therapy is right for you.  Use sunscreen with a sun protection factor (SPF) of 30 or greater. Apply sunscreen liberally and repeatedly throughout the day. You should seek shade when your shadow is shorter than you. Protect yourself by wearing long sleeves, pants, a wide-brimmed hat, and sunglasses year round, whenever you are outdoors.  Notify your caregiver of new moles or changes in moles, especially if there is a change in shape or color. Also notify your caregiver if a mole is larger than the size of a pencil eraser.  Stay current with your immunizations. Document Released: 09/04/2010 Document Revised: 05/14/2011 Document Reviewed: 09/04/2010 [facility] Patient Information 2013 [place], [place].

## 2012-07-16 NOTE — Progress Notes (Signed)
Subjective:    Patient ID: [name], female    DOB: 25-Dec-1960, 52 y.o.   MRN: [id number]  HPI  Patient presents to the office today for annual health and wellness screening.   No complaints today.  Patient states that she has been working hard on her diet and exercise at home and has noticed her clothes getting bigger.  She has increased her fruit and vegetable intake.     Past Medical History  Diagnosis Date  . Allergic rhinitis, seasonal   . Hyperlipidemia    Family History  Problem Relation Age of Onset  . Hypertension Father   . Hyperlipidemia Father   . Melanoma Mother 58  . Dementia Maternal Grandmother 90      Review of Systems  Constitutional: Negative for fever, chills and fatigue.  Respiratory: Negative for cough, chest tightness, shortness of breath and wheezing.   Cardiovascular: Negative for chest pain, palpitations and leg swelling.  Gastrointestinal: Negative for nausea, vomiting, abdominal pain, diarrhea, constipation and blood in stool.  Genitourinary: Negative for dysuria, urgency, frequency and hematuria.  Musculoskeletal: Negative for joint swelling, arthralgias and gait problem.  Neurological: Negative for dizziness, weakness, light-headedness, numbness and headaches.  Psychiatric/Behavioral: Negative for behavioral problems, sleep disturbance, dysphoric mood and decreased concentration. The patient is not nervous/anxious.        Objective:   Physical Exam  Nursing note and vitals reviewed. Constitutional: She is oriented to person, place, and time. She appears well-developed and well-nourished. No distress.  HENT:  Head: Normocephalic and atraumatic.  Eyes: Conjunctivae are normal. No scleral icterus.  Neck: Normal range of motion. Neck supple. No JVD present. No thyromegaly present.  Cardiovascular: Normal rate, regular rhythm and normal heart sounds.  Exam reveals no gallop and no friction rub.   No murmur heard. Pulmonary/Chest: Effort  normal and breath sounds normal. No respiratory distress. She has no wheezes. She has no rales. She exhibits no tenderness.  Musculoskeletal: Normal range of motion. She exhibits no edema.  Lymphadenopathy:    She has no cervical adenopathy.  Neurological: She is alert and oriented to person, place, and time.  Skin: Skin is warm and dry. She is not diaphoretic.  Psychiatric: She has a normal mood and affect. Her behavior is normal.    Filed Vitals:   07/16/12 1052  BP: 110/70  Pulse: 59  Temp: 98 F (36.7 C)  TempSrc: Oral  Weight: 159 lb 12.8 oz (72.485 kg)  SpO2: 97%   Wt Readings from Last 3 Encounters:  07/16/12 159 lb 12.8 oz (72.485 kg)  02/12/12 164 lb 12.8 oz (74.753 kg)  12/05/11 170 lb 12.8 oz (77.474 kg)   Lab Results  Component Value Date   WBC 5.4 07/10/2012   HGB 13.7 07/10/2012   HCT 39.3 07/10/2012   PLT 149.0* 07/10/2012   GLUCOSE 97 07/10/2012   CHOL 246* 07/10/2012   TRIG 84.0 07/10/2012   HDL 70.80 07/10/2012   LDLDIRECT 157.6 07/10/2012   ALT 27 07/10/2012   AST 22 07/10/2012   NA 138 07/10/2012   K 4.0 07/10/2012   CL 103 07/10/2012   CREATININE 0.8 07/10/2012   BUN 13 07/10/2012   CO2 26 07/10/2012   TSH 1.29 07/10/2012         Assessment & Plan:  Patient presents to the office for CPX/v70.0:  Past medical history and interval events were reviewed.  All age appropriate screenings were discussed.  Patient to see Dr. Henderson Cloud next week who will  refer for mammogram.  Repeat colonoscopy to occur this year, as previous colonoscopy in 2013 was incomplete.  All vaccinations are currently up to date.    Obesity:  Patient is actively working on diet and exercise.  We have urged her to continue to keep up with the good work.    Hyperlipidemia:  Risks and benefits of statin therapy discussed today.  We will continue to monitor cholesterol for downward trend.  Exercise and diet therapy encouraged.  Patient to return for CPX in 1 year.        I have personally reviewed this case with  PA student. I also personally examined this patient. I agree with history and findings as documented above. I reviewed, discussed and approve of the assessment and plan as listed above. Rene Paci, MD

## 2012-07-16 NOTE — Progress Notes (Signed)
  Subjective:    Patient ID: [name], female    DOB: [birth year], 52 y.o.   MRN: [id number]  HPI  patient is here today for annual physical. Patient feels well and has no complaints.  Remains concerned about cholesterol  Past Medical History  Diagnosis Date  . Allergic rhinitis, seasonal   . Hyperlipidemia    Family History  Problem Relation Age of Onset  . Hypertension Father   . Hyperlipidemia Father   . Melanoma Mother 88  . Dementia Maternal Grandmother 55   History  Substance Use Topics  . Smoking status: Never Smoker   . 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 Alcohol Use: Yes    Review of Systems Constitutional: Negative for fever or weight change.  Respiratory: Negative for cough and shortness of breath.   Cardiovascular: Negative for chest pain or palpitations.  Gastrointestinal: Negative for abdominal pain, no bowel changes.  Musculoskeletal: Negative for gait problem or joint swelling.  Skin: Negative for rash.  Neurological: Negative for dizziness or headache.  No other specific complaints in a complete review of systems (except as listed in HPI above).     Objective:   Physical Exam BP 110/70  Pulse 59  Temp(Src) 98 F (36.7 C) (Oral)  Wt 159 lb 12.8 oz (72.485 kg)  BMI 28.31 kg/m2  SpO2 97% Wt Readings from Last 3 Encounters:  07/16/12 159 lb 12.8 oz (72.485 kg)  02/12/12 164 lb 12.8 oz (74.753 kg)  12/05/11 170 lb 12.8 oz (77.474 kg)   Constitutional: She appears well-developed and well-nourished. No distress.  HENT: Head: Normocephalic and atraumatic. Ears: B TMs ok, no erythema or effusion; Nose: Nose normal. Mouth/Throat: Oropharynx is clear and moist. No oropharyngeal exudate.  Eyes: Conjunctivae and EOM are normal. Pupils are equal, round, and reactive to light. No scleral icterus.  Neck: Normal range of motion. Neck supple. No JVD present. No  thyromegaly present.  Cardiovascular: Normal rate, regular rhythm and normal heart sounds.  No murmur heard. No BLE edema. Pulmonary/Chest: Effort normal and breath sounds normal. No respiratory distress. She has no wheezes.  Abdominal: Soft. Bowel sounds are normal. She exhibits no distension. There is no tenderness. no masses Musculoskeletal: Normal range of motion, no joint effusions. No gross deformities Neurological: She is alert and oriented to person, place, and time. No cranial nerve deficit. Coordination, balance, strength, speech and gait are normal.  Skin: Skin is warm and dry. No rash noted. No erythema.  Psychiatric: She has a normal mood and affect. Her behavior is normal. Judgment and thought content normal.   Lab Results  Component Value Date   WBC 5.4 07/10/2012   HGB 13.7 07/10/2012   HCT 39.3 07/10/2012   PLT 149.0* 07/10/2012   GLUCOSE 97 07/10/2012   CHOL 246* 07/10/2012   TRIG 84.0 07/10/2012   HDL 70.80 07/10/2012   LDLDIRECT 157.6 07/10/2012   ALT 27 07/10/2012   AST 22 07/10/2012   NA 138 07/10/2012   K 4.0 07/10/2012   CL 103 07/10/2012   CREATININE 0.8 07/10/2012   BUN 13 07/10/2012   CO2 26 07/10/2012   TSH 1.29 07/10/2012       Assessment & Plan:   CPx/v70.0 - Patient has been counseled on age-appropriate routine health concerns for screening and prevention. These are reviewed and up-to-date. Immunizations are up-to-date or declined. Labs reviewed.

## 2012-07-21 ENCOUNTER — Encounter: Payer: Self-pay | Admitting: Internal Medicine

## 2012-08-22 LAB — HM COLONOSCOPY

## 2012-08-25 ENCOUNTER — Encounter: Payer: Self-pay | Admitting: Internal Medicine

## 2012-08-27 ENCOUNTER — Encounter: Payer: Self-pay | Admitting: Internal Medicine

## 2012-08-29 ENCOUNTER — Encounter: Payer: Self-pay | Admitting: Internal Medicine

## 2012-08-29 ENCOUNTER — Ambulatory Visit (INDEPENDENT_AMBULATORY_CARE_PROVIDER_SITE_OTHER): Payer: 59 | Admitting: Internal Medicine

## 2012-08-29 VITALS — BP 110/72 | HR 54 | Temp 97.4°F

## 2012-08-29 DIAGNOSIS — H5789 Other specified disorders of eye and adnexa: Secondary | ICD-10-CM

## 2012-08-29 DIAGNOSIS — H579 Unspecified disorder of eye and adnexa: Secondary | ICD-10-CM

## 2012-08-29 MED ORDER — AZELASTINE HCL 0.05 % OP SOLN: 1.0000 [drp] | Freq: Two times a day (BID) | OPHTHALMIC | Status: DC

## 2012-08-29 NOTE — Patient Instructions (Signed)
Good to see you today Use allergy eyedrop as discussed twice daily - Your prescription(s) have been submitted to your pharmacy. Please take as directed and contact our office if you believe you are having problem(s) with the medication(s). Use cool compress, avoid makeup and irritation to the eye Call if vision changes, pain, redness or discharge develop - or seek evaluation urgently at eye specialist if needed

## 2012-08-29 NOTE — Progress Notes (Signed)
  Subjective:    Patient ID: [name], female    DOB: 11-Dec-1960, 52 y.o.   MRN: [id number]  HPI  complains of L itchy eye Onset >1 week ago No history of same, no exposure to pinkeye Has not tried over-the-counter therapy chronic eye moisturizer drops ongoing without change  Past Medical History  Diagnosis Date  . Allergic rhinitis, seasonal   . Hyperlipidemia     Review of Systems  Eyes: Positive for itching. Negative for photophobia, pain, discharge and visual disturbance.  Neurological: Negative for facial asymmetry and headaches.       Objective:   Physical Exam  BP 110/72  Pulse 54  Temp(Src) 97.4 F (36.3 C) (Oral)  SpO2 96% Wt Readings from Last 3 Encounters:  07/16/12 159 lb 12.8 oz (72.485 kg)  02/12/12 164 lb 12.8 oz (74.753 kg)  12/05/11 170 lb 12.8 oz (77.474 kg)   Constitutional: She appears well-developed and well-nourished. No distress.  HENT: Head: Normocephalic and atraumatic. Ears: B TMs ok, no erythema or effusion; Nose: Nose normal. Mouth/Throat: Oropharynx is clear and moist. No oropharyngeal exudate.  Eyes: Conjunctivae and EOM are normal. Pupils are equal, round, and reactive to light. No scleral icterus. mild injection of left side with hyperemia of lower eyelid. Mild eczema-like changes left lower eyelid. Cardiovascular: Normal rate, regular rhythm and normal heart sounds.  No murmur heard. No BLE edema. Pulmonary/Chest: Effort normal and breath sounds normal. No respiratory distress. She has no wheezes.  Neurological: She is alert and oriented to person, place, and time. No cranial nerve deficit. Coordination, balance, strength, speech and gait are normal.  Psychiatric: She has a normal mood and affect. Her behavior is normal. Judgment and thought content normal.   Lab Results  Component Value Date   WBC 5.4 07/10/2012   HGB 13.7 07/10/2012   HCT 39.3 07/10/2012   PLT 149.0* 07/10/2012   GLUCOSE 97 07/10/2012   CHOL 246* 07/10/2012   TRIG 84.0  07/10/2012   HDL 70.80 07/10/2012   LDLDIRECT 157.6 07/10/2012   ALT 27 07/10/2012   AST 22 07/10/2012   NA 138 07/10/2012   K 4.0 07/10/2012   CL 103 07/10/2012   CREATININE 0.8 07/10/2012   BUN 13 07/10/2012   CO2 26 07/10/2012   TSH 1.29 07/10/2012       Assessment & Plan:   Left eye itching  Treat for allergic conjunctivitis Advised on periorbital dermatitis No vision change eye pain or other red flags on exam/hx Patient agrees to call if symptoms unimproved or worse, or to seek care at ophthalmology prn

## 2012-09-22 ENCOUNTER — Encounter: Payer: Self-pay | Admitting: Internal Medicine

## 2012-09-22 DIAGNOSIS — H01139 Eczematous dermatitis of unspecified eye, unspecified eyelid: Secondary | ICD-10-CM

## 2012-09-23 ENCOUNTER — Encounter: Payer: Self-pay | Admitting: Internal Medicine

## 2012-09-24 MED ORDER — ERYTHROMYCIN 2 % EX GEL: Freq: Two times a day (BID) | CUTANEOUS | Status: DC

## 2012-12-03 ENCOUNTER — Encounter: Payer: Self-pay | Admitting: Internal Medicine

## 2013-07-17 ENCOUNTER — Encounter: Payer: 59 | Admitting: Internal Medicine

## 2013-07-23 ENCOUNTER — Encounter: Payer: Self-pay | Admitting: Internal Medicine

## 2013-07-23 ENCOUNTER — Ambulatory Visit (INDEPENDENT_AMBULATORY_CARE_PROVIDER_SITE_OTHER): Payer: 59 | Admitting: Internal Medicine

## 2013-07-23 VITALS — BP 110/72 | HR 63 | Temp 98.0°F | Wt 158.2 lb

## 2013-07-23 DIAGNOSIS — Z0000 Encounter for general adult medical examination without abnormal findings: Secondary | ICD-10-CM

## 2013-07-23 NOTE — Progress Notes (Signed)
Pre visit review using our clinic review tool, if applicable. No additional management support is needed unless otherwise documented below in the visit note. 

## 2013-07-23 NOTE — Patient Instructions (Addendum)
It was good to see you today.  We have reviewed your prior records including labs and tests today  Health Maintenance reviewed - all recommended immunizations and age-appropriate screenings are up-to-date.  Test(s) ordered today. Your results will be released to [facility] (or called to you) after review, usually within 72hours after test completion. If any changes need to be made, you will be notified at that same time.  Medications reviewed and updated, no changes recommended at this time.  Please schedule followup in 12 months for annual exam and labs, call sooner if problems.  Health Maintenance, Female A healthy lifestyle and preventative care can promote health and wellness.  Maintain regular health, dental, and eye exams.  Eat a healthy diet. Foods like vegetables, fruits, whole grains, low-fat dairy products, and lean protein foods contain the nutrients you need without too many calories. Decrease your intake of foods high in solid fats, added sugars, and salt. Get information about a proper diet from your caregiver, if necessary.  Regular physical exercise is one of the most important things you can do for your health. Most adults should get at least 150 minutes of moderate-intensity exercise (any activity that increases your heart rate and causes you to sweat) each week. In addition, most adults need muscle-strengthening exercises on 2 or more days a week.   Maintain a healthy weight. The body mass index (BMI) is a screening tool to identify possible weight problems. It provides an estimate of body fat based on height and weight. Your caregiver can help determine your BMI, and can help you achieve or maintain a healthy weight. For adults 20 years and older:  A BMI below 18.5 is considered underweight.  A BMI of 18.5 to 24.9 is normal.  A BMI of 25 to 29.9 is considered overweight.  A BMI of 30 and above is considered obese.  Maintain normal blood lipids and cholesterol by  exercising and minimizing your intake of saturated fat. Eat a balanced diet with plenty of fruits and vegetables. Blood tests for lipids and cholesterol should begin at age 96 and be repeated every 5 years. If your lipid or cholesterol levels are high, you are over 50, or you are a high risk for heart disease, you may need your cholesterol levels checked more frequently.Ongoing high lipid and cholesterol levels should be treated with medicines if diet and exercise are not effective.  If you smoke, find out from your caregiver how to quit. If you do not use tobacco, do not start.  Lung cancer screening is recommended for adults aged 32 80 years who are at high risk for developing lung cancer because of a history of smoking. Yearly low-dose computed tomography (CT) is recommended for people who have at least a 30-pack-year history of smoking and are a current smoker or have quit within the past 15 years. A pack year of smoking is smoking an average of 1 pack of cigarettes a day for 1 year (for example: 1 pack a day for 30 years or 2 packs a day for 15 years). Yearly screening should continue until the smoker has stopped smoking for at least 15 years. Yearly screening should also be stopped for people who develop a health problem that would prevent them from having lung cancer treatment.  If you are pregnant, do not drink alcohol. If you are breastfeeding, be very cautious about drinking alcohol. If you are not pregnant and choose to drink alcohol, do not exceed 1 drink per day. One drink  is considered to be 12 ounces (355 mL) of beer, 5 ounces (148 mL) of wine, or 1.5 ounces (44 mL) of liquor.  Avoid use of street drugs. Do not share needles with anyone. Ask for help if you need support or instructions about stopping the use of drugs.  High blood pressure causes heart disease and increases the risk of stroke. Blood pressure should be checked at least every 1 to 2 years. Ongoing high blood pressure should be  treated with medicines, if weight loss and exercise are not effective.  If you are 39 to 53 years old, ask your caregiver if you should take aspirin to prevent strokes.  Diabetes screening involves taking a blood sample to check your fasting blood sugar level. This should be done once every 3 years, after age 66, if you are within normal weight and without risk factors for diabetes. Testing should be considered at a younger age or be carried out more frequently if you are overweight and have at least 1 risk factor for diabetes.  Breast cancer screening is essential preventative care for women. You should practice "breast self-awareness." This means understanding the normal appearance and feel of your breasts and may include breast self-examination. Any changes detected, no matter how small, should be reported to a caregiver. Women in their 73s and 30s should have a clinical breast exam (CBE) by a caregiver as part of a regular health exam every 1 to 3 years. After age 39, women should have a CBE every year. Starting at age 91, women should consider having a mammogram (breast X-ray) every year. Women who have a family history of breast cancer should talk to their caregiver about genetic screening. Women at a high risk of breast cancer should talk to their caregiver about having an MRI and a mammogram every year.  Breast cancer gene (BRCA)-related cancer risk assessment is recommended for women who have family members with BRCA-related cancers. BRCA-related cancers include breast, ovarian, tubal, and peritoneal cancers. Having family members with these cancers may be associated with an increased risk for harmful changes (mutations) in the breast cancer genes BRCA1 and BRCA2. Results of the assessment will determine the need for genetic counseling and BRCA1 and BRCA2 testing.  The Pap test is a screening test for cervical cancer. Women should have a Pap test starting at age 63. Between ages 85 and 56, Pap  tests should be repeated every 2 years. Beginning at age 29, you should have a Pap test every 3 years as long as the past 3 Pap tests have been normal. If you had a hysterectomy for a problem that was not cancer or a condition that could lead to cancer, then you no longer need Pap tests. If you are between ages 49 and 47, and you have had normal Pap tests going back 10 years, you no longer need Pap tests. If you have had past treatment for cervical cancer or a condition that could lead to cancer, you need Pap tests and screening for cancer for at least 20 years after your treatment. If Pap tests have been discontinued, risk factors (such as a new sexual partner) need to be reassessed to determine if screening should be resumed. Some women have medical problems that increase the chance of getting cervical cancer. In these cases, your caregiver may recommend more frequent screening and Pap tests.  The human papillomavirus (HPV) test is an additional test that may be used for cervical cancer screening. The HPV test looks for  the virus that can cause the cell changes on the cervix. The cells collected during the Pap test can be tested for HPV. The HPV test could be used to screen women aged 31 years and older, and should be used in women of any age who have unclear Pap test results. After the age of 35, women should have HPV testing at the same frequency as a Pap test.  Colorectal cancer can be detected and often prevented. Most routine colorectal cancer screening begins at the age of 76 and continues through age 69. However, your caregiver may recommend screening at an earlier age if you have risk factors for colon cancer. On a yearly basis, your caregiver may provide home test kits to check for hidden blood in the stool. Use of a small camera at the end of a tube, to directly examine the colon (sigmoidoscopy or colonoscopy), can detect the earliest forms of colorectal cancer. Talk to your caregiver about this at  age 68, when routine screening begins. Direct examination of the colon should be repeated every 5 to 10 years through age 84, unless early forms of pre-cancerous polyps or small growths are found.  Hepatitis C blood testing is recommended for all people born from [birth year] through 1965 and any individual with known risks for hepatitis C.  Practice safe sex. Use condoms and avoid high-risk sexual practices to reduce the spread of sexually transmitted infections (STIs). Sexually active women aged 66 and younger should be checked for Chlamydia, which is a common sexually transmitted infection. Older women with new or multiple partners should also be tested for Chlamydia. Testing for other STIs is recommended if you are sexually active and at increased risk.  Osteoporosis is a disease in which the bones lose minerals and strength with aging. This can result in serious bone fractures. The risk of osteoporosis can be identified using a bone density scan. Women ages 65 and over and women at risk for fractures or osteoporosis should discuss screening with their caregivers. Ask your caregiver whether you should be taking a calcium supplement or vitamin D to reduce the rate of osteoporosis.  Menopause can be associated with physical symptoms and risks. Hormone replacement therapy is available to decrease symptoms and risks. You should talk to your caregiver about whether hormone replacement therapy is right for you.  Use sunscreen. Apply sunscreen liberally and repeatedly throughout the day. You should seek shade when your shadow is shorter than you. Protect yourself by wearing long sleeves, pants, a wide-brimmed hat, and sunglasses year round, whenever you are outdoors.  Notify your caregiver of new moles or changes in moles, especially if there is a change in shape or color. Also notify your caregiver if a mole is larger than the size of a pencil eraser.  Stay current with your immunizations. Document Released:  09/04/2010 Document Revised: 06/16/2012 Document Reviewed: 09/04/2010 [facility] Patient Information 2014 [place].

## 2013-07-23 NOTE — Progress Notes (Signed)
Subjective:    Patient ID: [name], female    DOB: [birth year], 53 y.o.   MRN: [id number]  HPI  patient is here today for annual physical. Patient feels well and has no complaints.  Also reviewed chronic medical issues and interval medical events  Past Medical History  Diagnosis Date  . Allergic rhinitis, seasonal   . Hyperlipidemia    Family History  Problem Relation Age of Onset  . Hypertension Father   . Hyperlipidemia Father   . Melanoma Mother 61  . Dementia Maternal Grandmother 22   History  Substance Use Topics  . Smoking status: Never Smoker   . 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 Alcohol Use: Yes    Review of Systems  Constitutional: Negative for fatigue and unexpected weight change.  Respiratory: Negative for cough, shortness of breath and wheezing.   Cardiovascular: Negative for chest pain, palpitations and leg swelling.  Gastrointestinal: Negative for nausea, abdominal pain and diarrhea.  Neurological: Negative for dizziness, weakness, light-headedness and headaches.  Psychiatric/Behavioral: Negative for dysphoric mood. The patient is not nervous/anxious.   All other systems reviewed and are negative.      Objective:   Physical Exam  BP 110/72  Pulse 63  Temp(Src) 98 F (36.7 C) (Oral)  Wt 158 lb 3.2 oz (71.759 kg)  SpO2 95% Wt Readings from Last 3 Encounters:  07/23/13 158 lb 3.2 oz (71.759 kg)  07/16/12 159 lb 12.8 oz (72.485 kg)  02/12/12 164 lb 12.8 oz (74.753 kg)   Constitutional: She appears well-developed and well-nourished. No distress.  HENT: Head: Normocephalic and atraumatic. Ears: B TMs ok, no erythema or effusion; Nose: Nose normal. Mouth/Throat: Oropharynx is clear and moist. No oropharyngeal exudate.  Eyes: Conjunctivae and EOM are normal. Pupils are equal, round, and reactive to light. No scleral icterus.  Neck: Normal range of motion.  Neck supple. No JVD present. No thyromegaly present.  Cardiovascular: Normal rate, regular rhythm and normal heart sounds.  No murmur heard. No BLE edema. Pulmonary/Chest: Effort normal and breath sounds normal. No respiratory distress. She has no wheezes.  Abdominal: Soft. Bowel sounds are normal. She exhibits no distension. There is no tenderness. no masses Musculoskeletal: Normal range of motion, no joint effusions. No gross deformities Neurological: She is alert and oriented to person, place, and time. No cranial nerve deficit. Coordination, balance, strength, speech and gait are normal.  Skin: Skin is warm and dry. No rash noted. No erythema.  Psychiatric: She has a normal mood and affect. Her behavior is normal. Judgment and thought content normal.    Lab Results  Component Value Date   WBC 5.4 07/10/2012   HGB 13.7 07/10/2012   HCT 39.3 07/10/2012   PLT 149.0* 07/10/2012   GLUCOSE 97 07/10/2012   CHOL 246* 07/10/2012   TRIG 84.0 07/10/2012   HDL 70.80 07/10/2012   LDLDIRECT 157.6 07/10/2012   ALT 27 07/10/2012   AST 22 07/10/2012   NA 138 07/10/2012   K 4.0 07/10/2012   CL 103 07/10/2012   CREATININE 0.8 07/10/2012   BUN 13 07/10/2012   CO2 26 07/10/2012   TSH 1.29 07/10/2012    US Breast Left  02/02/2011   *RADIOLOGY REPORT*  Clinical Data:  The patient notes tender palpable mass within the upper-outer quadrant left breast.  There is a family history breast cancer in an aunt  at age 46.  DIGITAL DIAGNOSTIC BILATERAL MAMMOGRAM WITH CAD AND LEFT BREAST ULTRASOUND:  Comparison:  08/27/2008, 08/19/2008, 02/11/2006.  Findings:  There is an extremely dense parenchymal pattern.  There is an oval, partially obscured mass located within the upper-outer quadrant of the left breast which by mammography measures approximately 4 cm in size.  There is no distortion or worrisome calcification within either breast.  There are scattered punctate calcifications noted bilaterally - stable. Mammographic images were processed with  CAD.  On physical exam, there is a firm, tender, freely mobile, palpable mass located within the upper-outer quadrant of the left breast at the 2 o'clock position which by physical examination measures approximately 4 cm in size.  Ultrasound is performed, showing a cyst with low-level internal echoes and mild diffuse wall thickening located within the left breast at the 2 o'clock position 6 cm from nipple.  This corresponds to the palpable finding.  This measures maximally approximately 4 cm in size by ultrasound.  Ultrasound of the left axilla demonstrates several normal-appearing left axillary lymph nodes with normally preserved hilar regions.  Due to the complicated appearance of the cyst located within the left breast at 2 o'clock position (and tenderness) cyst aspiration is recommended.  This will be performed and reported separately.  IMPRESSION:  1.  Complicated 4 cm cyst located within the left breast at the 2 o'clock position corresponding to the palpable abnormality.  Cyst aspiration is recommended and this will be performed and reported separately.  BI-RADS CATEGORY 0:  Incomplete.  Need additional imaging evaluation and/or prior mammograms for comparison.  Original Report Authenticated By: Altamese Cabal, M.D.  US Aspiration  02/02/2011   *RADIOLOGY REPORT*  Clinical Data:  Tender complicated left breast cyst.  ULTRASOUND GUIDED ASPIRATION OF THE LEFT BREAST  I met with the patient, and we discussed the procedure of ultrasound-guided cyst aspiration, including benefits and alternatives.  We discussed the high likelihood of a successful procedure. We discussed the risks of the procedure, including infection, bleeding, tissue injury, and inadequate sampling. Informed, written consent was given.  Using sterile technique,  2% lidocaine, ultrasound guidance, and 18 gauge spinal needle, aspiration was performed of the tender complicated cyst located within the left breast at the 2 o'clock position.  10  ml of nonbloody tan colored cyst fluid were removed. The cyst fully collapsed.  IMPRESSION: Ultrasound-guided aspiration of the complicated left breast cyst located at the 2 o'clock position.  Recommend follow-up left breast ultrasound in 3 months.  Breast self-examination was reviewed with the patient.  BI-RADS CATEGORY 3:  Probably benign finding(s) - short interval follow-up suggested.  Original Report Authenticated By: Altamese Cabal, M.D.  Mm Digital Diagnostic Bilat  02/02/2011   *RADIOLOGY REPORT*  Clinical Data:  The patient notes tender palpable mass within the upper-outer quadrant left breast.  There is a family history breast cancer in an aunt at age 69.  DIGITAL DIAGNOSTIC BILATERAL MAMMOGRAM WITH CAD AND LEFT BREAST ULTRASOUND:  Comparison:  08/27/2008, 08/19/2008, 02/11/2006.  Findings:  There is an extremely dense parenchymal pattern.  There is an oval, partially obscured mass located within the upper-outer quadrant of the left breast which by mammography measures approximately 4 cm in size.  There is no distortion or worrisome calcification within either breast.  There are scattered punctate calcifications noted bilaterally - stable. Mammographic images were processed with CAD.  On physical exam, there is a firm, tender, freely mobile, palpable mass located within the upper-outer quadrant of the left breast  at the 2 o'clock position which by physical examination measures approximately 4 cm in size.  Ultrasound is performed, showing a cyst with low-level internal echoes and mild diffuse wall thickening located within the left breast at the 2 o'clock position 6 cm from nipple.  This corresponds to the palpable finding.  This measures maximally approximately 4 cm in size by ultrasound.  Ultrasound of the left axilla demonstrates several normal-appearing left axillary lymph nodes with normally preserved hilar regions.  Due to the complicated appearance of the cyst located within the left breast at  2 o'clock position (and tenderness) cyst aspiration is recommended.  This will be performed and reported separately.  IMPRESSION:  1.  Complicated 4 cm cyst located within the left breast at the 2 o'clock position corresponding to the palpable abnormality.  Cyst aspiration is recommended and this will be performed and reported separately.  BI-RADS CATEGORY 0:  Incomplete.  Need additional imaging evaluation and/or prior mammograms for comparison.  Original Report Authenticated By: Altamese Cabal, M.D.   ECG, sinus bradycardia at 56 beats per minute. Negative precordial T waves, no change from July 2011. No acute ischemic change or arrhythmia     Assessment & Plan:   CPX/v70.0 - Patient has been counseled on age-appropriate routine health concerns for screening and prevention. These are reviewed and up-to-date. Immunizations are up-to-date or declined. Labs and ECG reviewed.

## 2013-07-31 ENCOUNTER — Other Ambulatory Visit (INDEPENDENT_AMBULATORY_CARE_PROVIDER_SITE_OTHER): Payer: 59

## 2013-07-31 DIAGNOSIS — Z0000 Encounter for general adult medical examination without abnormal findings: Secondary | ICD-10-CM

## 2013-07-31 LAB — HEPATIC FUNCTION PANEL
ALBUMIN: 3.9 g/dL (ref 3.5–5.2)
ALK PHOS: 54 U/L (ref 39–117)
ALT: 19 U/L (ref 0–35)
AST: 21 U/L (ref 0–37)
Bilirubin, Direct: 0.1 mg/dL (ref 0.0–0.3)
TOTAL PROTEIN: 6.4 g/dL (ref 6.0–8.3)
Total Bilirubin: 0.8 mg/dL (ref 0.2–1.2)

## 2013-07-31 LAB — URINALYSIS, ROUTINE W REFLEX MICROSCOPIC
Bilirubin Urine: NEGATIVE
HGB URINE DIPSTICK: NEGATIVE
KETONES UR: NEGATIVE
Leukocytes, UA: NEGATIVE
Nitrite: NEGATIVE
Specific Gravity, Urine: 1.015 (ref 1.000–1.030)
Total Protein, Urine: NEGATIVE
URINE GLUCOSE: NEGATIVE
UROBILINOGEN UA: 0.2 (ref 0.0–1.0)
pH: 7 (ref 5.0–8.0)

## 2013-07-31 LAB — CBC WITH DIFFERENTIAL/PLATELET
BASOS ABS: 0 10*3/uL (ref 0.0–0.1)
BASOS PCT: 0.4 % (ref 0.0–3.0)
Eosinophils Absolute: 0.2 10*3/uL (ref 0.0–0.7)
Eosinophils Relative: 3.5 % (ref 0.0–5.0)
HEMATOCRIT: 41.6 % (ref 36.0–46.0)
HEMOGLOBIN: 14.4 g/dL (ref 12.0–15.0)
LYMPHS ABS: 2.3 10*3/uL (ref 0.7–4.0)
Lymphocytes Relative: 39.6 % (ref 12.0–46.0)
MCHC: 34.5 g/dL (ref 30.0–36.0)
MCV: 88.9 fl (ref 78.0–100.0)
MONOS PCT: 6.4 % (ref 3.0–12.0)
Monocytes Absolute: 0.4 10*3/uL (ref 0.1–1.0)
NEUTROS ABS: 3 10*3/uL (ref 1.4–7.7)
Neutrophils Relative %: 50.1 % (ref 43.0–77.0)
Platelets: 163 10*3/uL (ref 150.0–400.0)
RBC: 4.68 Mil/uL (ref 3.87–5.11)
RDW: 13.7 % (ref 11.5–15.5)
WBC: 5.9 10*3/uL (ref 4.0–10.5)

## 2013-07-31 LAB — BASIC METABOLIC PANEL
BUN: 18 mg/dL (ref 6–23)
CO2: 28 meq/L (ref 19–32)
Calcium: 8.9 mg/dL (ref 8.4–10.5)
Chloride: 104 mEq/L (ref 96–112)
Creatinine, Ser: 0.9 mg/dL (ref 0.4–1.2)
GFR: 74.49 mL/min (ref >60.00)
GLUCOSE: 85 mg/dL (ref 70–99)
POTASSIUM: 3.8 meq/L (ref 3.5–5.1)
SODIUM: 138 meq/L (ref 135–145)

## 2013-07-31 LAB — LIPID PANEL
CHOLESTEROL: 265 mg/dL — AB (ref 0–200)
HDL: 70.1 mg/dL (ref >39.00)
LDL Cholesterol: 169 mg/dL — ABNORMAL HIGH (ref 0–99)
Total CHOL/HDL Ratio: 4
Triglycerides: 129 mg/dL (ref 0.0–149.0)
VLDL: 25.8 mg/dL (ref 0.0–40.0)

## 2013-07-31 LAB — TSH: TSH: 1.73 u[IU]/mL (ref 0.35–4.50)

## 2013-08-03 LAB — HM MAMMOGRAPHY

## 2013-08-11 ENCOUNTER — Encounter: Payer: Self-pay | Admitting: Internal Medicine

## 2013-08-12 MED ORDER — BENZONATATE 200 MG PO CAPS: 200.0000 mg | ORAL_CAPSULE | Freq: Three times a day (TID) | ORAL | Status: DC | PRN

## 2014-07-28 ENCOUNTER — Encounter: Payer: Self-pay | Admitting: Internal Medicine

## 2014-07-28 ENCOUNTER — Ambulatory Visit (INDEPENDENT_AMBULATORY_CARE_PROVIDER_SITE_OTHER): Payer: 59 | Admitting: Internal Medicine

## 2014-07-28 VITALS — BP 102/60 | HR 58 | Temp 97.7°F | Ht 63.0 in | Wt 164.8 lb

## 2014-07-28 DIAGNOSIS — E663 Overweight: Secondary | ICD-10-CM | POA: Insufficient documentation

## 2014-07-28 DIAGNOSIS — Z0000 Encounter for general adult medical examination without abnormal findings: Secondary | ICD-10-CM

## 2014-07-28 NOTE — Patient Instructions (Addendum)
It was good to see you today.  We have reviewed your prior records including labs and tests today  Health Maintenance reviewed - all recommended immunizations and age-appropriate screenings are up-to-date.  Test(s) ordered today. Your results will be released to [place] (or called to you) after review, usually within 72hours after test completion. If any changes need to be made, you will be notified at that same time.  Medications reviewed and updated, no changes recommended at this time.  Please schedule followup in 12 months for annual exam and labs, call sooner if problems.  Health Maintenance Adopting a healthy lifestyle and getting preventive care can go a long way to promote health and wellness. Talk with your health care provider about what schedule of regular examinations is right for you. This is a good chance for you to check in with your provider about disease prevention and staying healthy. In between checkups, there are plenty of things you can do on your own. Experts have done a lot of research about which lifestyle changes and preventive measures are most likely to keep you healthy. Ask your health care provider for more information. WEIGHT AND DIET  Eat a healthy diet  Be sure to include plenty of vegetables, fruits, low-fat dairy products, and lean protein.  Do not eat a lot of foods high in solid fats, added sugars, or salt.  Get regular exercise. This is one of the most important things you can do for your health.  Most adults should exercise for at least 150 minutes each week. The exercise should increase your heart rate and make you sweat (moderate-intensity exercise).  Most adults should also do strengthening exercises at least twice a week. This is in addition to the moderate-intensity exercise.  Maintain a healthy weight  Body mass index (BMI) is a measurement that can be used to identify possible weight problems. It estimates body fat based on height and weight.  Your health care provider can help determine your BMI and help you achieve or maintain a healthy weight.  For females 20 years of age and older:   A BMI below 18.5 is considered underweight.  A BMI of 18.5 to 24.9 is normal.  A BMI of 25 to 29.9 is considered overweight.  A BMI of 30 and above is considered obese.  Watch levels of cholesterol and blood lipids  You should start having your blood tested for lipids and cholesterol at 54 years of age, then have this test every 5 years.  You may need to have your cholesterol levels checked more often if:  Your lipid or cholesterol levels are high.  You are older than 54 years of age.  You are at high risk for heart disease.  CANCER SCREENING   Lung Cancer  Lung cancer screening is recommended for adults 1-59 years old who are at high risk for lung cancer because of a history of smoking.  A yearly low-dose CT scan of the lungs is recommended for people who:  Currently smoke.  Have quit within the past 15 years.  Have at least a 30-pack-year history of smoking. A pack year is smoking an average of one pack of cigarettes a day for 1 year.  Yearly screening should continue until it has been 15 years since you quit.  Yearly screening should stop if you develop a health problem that would prevent you from having lung cancer treatment.  Breast Cancer  Practice breast self-awareness. This means understanding how your breasts normally appear and  feel.  It also means doing regular breast self-exams. Let your health care provider know about any changes, no matter how small.  If you are in your 20s or 30s, you should have a clinical breast exam (CBE) by a health care provider every 1-3 years as part of a regular health exam.  If you are 40 or older, have a CBE every year. Also consider having a breast X-ray (mammogram) every year.  If you have a family history of breast cancer, talk to your health care provider about genetic  screening.  If you are at high risk for breast cancer, talk to your health care provider about having an MRI and a mammogram every year.  Breast cancer gene (BRCA) assessment is recommended for women who have family members with BRCA-related cancers. BRCA-related cancers include:  Breast.  Ovarian.  Tubal.  Peritoneal cancers.  Results of the assessment will determine the need for genetic counseling and BRCA1 and BRCA2 testing. Cervical Cancer Routine pelvic examinations to screen for cervical cancer are no longer recommended for nonpregnant women who are considered low risk for cancer of the pelvic organs (ovaries, uterus, and vagina) and who do not have symptoms. A pelvic examination may be necessary if you have symptoms including those associated with pelvic infections. Ask your health care provider if a screening pelvic exam is right for you.   The Pap test is the screening test for cervical cancer for women who are considered at risk.  If you had a hysterectomy for a problem that was not cancer or a condition that could lead to cancer, then you no longer need Pap tests.  If you are older than 65 years, and you have had normal Pap tests for the past 10 years, you no longer need to have Pap tests.  If you have had past treatment for cervical cancer or a condition that could lead to cancer, you need Pap tests and screening for cancer for at least 20 years after your treatment.  If you no longer get a Pap test, assess your risk factors if they change (such as having a new sexual partner). This can affect whether you should start being screened again.  Some women have medical problems that increase their chance of getting cervical cancer. If this is the case for you, your health care provider may recommend more frequent screening and Pap tests.  The human papillomavirus (HPV) test is another test that may be used for cervical cancer screening. The HPV test looks for the virus that can  cause cell changes in the cervix. The cells collected during the Pap test can be tested for HPV.  The HPV test can be used to screen women 30 years of age and older. Getting tested for HPV can extend the interval between normal Pap tests from three to five years.  An HPV test also should be used to screen women of any age who have unclear Pap test results.  After 54 years of age, women should have HPV testing as often as Pap tests.  Colorectal Cancer  This type of cancer can be detected and often prevented.  Routine colorectal cancer screening usually begins at 54 years of age and continues through 54 years of age.  Your health care provider may recommend screening at an earlier age if you have risk factors for colon cancer.  Your health care provider may also recommend using home test kits to check for hidden blood in the stool.  A small camera   at the end of a tube can be used to examine your colon directly (sigmoidoscopy or colonoscopy). This is done to check for the earliest forms of colorectal cancer.  Routine screening usually begins at age 50.  Direct examination of the colon should be repeated every 5-10 years through 54 years of age. However, you may need to be screened more often if early forms of precancerous polyps or small growths are found. Skin Cancer  Check your skin from head to toe regularly.  Tell your health care provider about any new moles or changes in moles, especially if there is a change in a mole's shape or color.  Also tell your health care provider if you have a mole that is larger than the size of a pencil eraser.  Always use sunscreen. Apply sunscreen liberally and repeatedly throughout the day.  Protect yourself by wearing long sleeves, pants, a wide-brimmed hat, and sunglasses whenever you are outside. HEART DISEASE, DIABETES, AND HIGH BLOOD PRESSURE   Have your blood pressure checked at least every 1-2 years. High blood pressure causes heart  disease and increases the risk of stroke.  If you are between 55 years and 79 years old, ask your health care provider if you should take aspirin to prevent strokes.  Have regular diabetes screenings. This involves taking a blood sample to check your fasting blood sugar level.  If you are at a normal weight and have a low risk for diabetes, have this test once every three years after 54 years of age.  If you are overweight and have a high risk for diabetes, consider being tested at a younger age or more often. PREVENTING INFECTION  Hepatitis B  If you have a higher risk for hepatitis B, you should be screened for this virus. You are considered at high risk for hepatitis B if:  You were born in a country where hepatitis B is common. Ask your health care provider which countries are considered high risk.  Your parents were born in a high-risk country, and you have not been immunized against hepatitis B (hepatitis B vaccine).  You have HIV or AIDS.  You use needles to inject street drugs.  You live with someone who has hepatitis B.  You have had sex with someone who has hepatitis B.  You get hemodialysis treatment.  You take certain medicines for conditions, including cancer, organ transplantation, and autoimmune conditions. Hepatitis C  Blood testing is recommended for:  Everyone born from 1945 through 1965.  Anyone with known risk factors for hepatitis C. Sexually transmitted infections (STIs)  You should be screened for sexually transmitted infections (STIs) including gonorrhea and chlamydia if:  You are sexually active and are younger than 54 years of age.  You are older than 54 years of age and your health care provider tells you that you are at risk for this type of infection.  Your sexual activity has changed since you were last screened and you are at an increased risk for chlamydia or gonorrhea. Ask your health care provider if you are at risk.  If you do not have  HIV, but are at risk, it may be recommended that you take a prescription medicine daily to prevent HIV infection. This is called pre-exposure prophylaxis (PrEP). You are considered at risk if:  You are sexually active and do not regularly use condoms or know the HIV status of your partner(s).  You take drugs by injection.  You are sexually active with a partner   who has HIV. Talk with your health care provider about whether you are at high risk of being infected with HIV. If you choose to begin PrEP, you should first be tested for HIV. You should then be tested every 3 months for as long as you are taking PrEP.  PREGNANCY   If you are premenopausal and you may become pregnant, ask your health care provider about preconception counseling.  If you may become pregnant, take 400 to 800 micrograms (mcg) of folic acid every day.  If you want to prevent pregnancy, talk to your health care provider about birth control (contraception). OSTEOPOROSIS AND MENOPAUSE   Osteoporosis is a disease in which the bones lose minerals and strength with aging. This can result in serious bone fractures. Your risk for osteoporosis can be identified using a bone density scan.  If you are 65 years of age or older, or if you are at risk for osteoporosis and fractures, ask your health care provider if you should be screened.  Ask your health care provider whether you should take a calcium or vitamin D supplement to lower your risk for osteoporosis.  Menopause may have certain physical symptoms and risks.  Hormone replacement therapy may reduce some of these symptoms and risks. Talk to your health care provider about whether hormone replacement therapy is right for you.  HOME CARE INSTRUCTIONS   Schedule regular health, dental, and eye exams.  Stay current with your immunizations.   Do not use any tobacco products including cigarettes, chewing tobacco, or electronic cigarettes.  If you are pregnant, do not  drink alcohol.  If you are breastfeeding, limit how much and how often you drink alcohol.  Limit alcohol intake to no more than 1 drink per day for nonpregnant women. One drink equals 12 ounces of beer, 5 ounces of wine, or 1 ounces of hard liquor.  Do not use street drugs.  Do not share needles.  Ask your health care provider for help if you need support or information about quitting drugs.  Tell your health care provider if you often feel depressed.  Tell your health care provider if you have ever been abused or do not feel safe at home. Document Released: 09/04/2010 Document Revised: 07/06/2013 Document Reviewed: 01/21/2013 Exit[employer] Patient Information 2015 [employer]. This information is not intended to replace advice given to you by your health care provider. Make sure you discuss any questions you have with your health care provider.  

## 2014-07-28 NOTE — Progress Notes (Signed)
Subjective:    Patient ID: [name], female    DOB: 01-Jul-1960, 54 y.o.   MRN: [id number]  HPI  patient is here today for annual physical. Patient feels well and has no complaints.  Past Medical History  Diagnosis Date  . Allergic rhinitis, seasonal   . Hyperlipidemia    Family History  Problem Relation Age of Onset  . Hypertension Father   . Hyperlipidemia Father   . Melanoma Mother 7  . Dementia Maternal Grandmother 90  . Alcoholism      paternal side   History  Substance Use Topics  . Smoking status: Never Smoker   . Smokeless tobacco: Not on file  . Alcohol Use: 0.0 oz/week    0 Standard drinks or equivalent per week     Comment: occ    Review of Systems  Constitutional: Negative for fatigue and unexpected weight change.  Respiratory: Negative for cough, shortness of breath and wheezing.   Cardiovascular: Negative for chest pain, palpitations and leg swelling.  Gastrointestinal: Negative for nausea, abdominal pain and diarrhea.  Neurological: Negative for dizziness, weakness, light-headedness and headaches.  Psychiatric/Behavioral: Negative for dysphoric mood. The patient is not nervous/anxious.   All other systems reviewed and are negative.      Objective:    Physical Exam  Constitutional: She is oriented to person, place, and time. She appears well-developed and well-nourished. No distress.  overweight  HENT:  Head: Normocephalic and atraumatic.  Right Ear: External ear normal.  Left Ear: External ear normal.  Nose: Nose normal.  Mouth/Throat: Oropharynx is clear and moist. No oropharyngeal exudate.  Eyes: EOM are normal. Pupils are equal, round, and reactive to light. Right eye exhibits no discharge. Left eye exhibits no discharge. No scleral icterus.  Neck: Normal range of motion. Neck supple. No JVD present. No tracheal deviation present. No thyromegaly present.  Cardiovascular: Normal rate, regular rhythm, normal heart sounds and intact  distal pulses.  Exam reveals no friction rub.   No murmur heard. Pulmonary/Chest: Effort normal and breath sounds normal. No respiratory distress. She has no wheezes. She has no rales. She exhibits no tenderness.  Abdominal: Soft. Bowel sounds are normal. She exhibits no distension and no mass. There is no tenderness. There is no rebound and no guarding.  Genitourinary:  Defer to gyn  Musculoskeletal: Normal range of motion.  No gross deformities  Lymphadenopathy:    She has no cervical adenopathy.  Neurological: She is alert and oriented to person, place, and time. She has normal reflexes. No cranial nerve deficit.  Skin: Skin is warm and dry. No rash noted. She is not diaphoretic. No erythema.  Psychiatric: She has a normal mood and affect. Her behavior is normal. Judgment and thought content normal.  Nursing note and vitals reviewed.   BP 102/60 mmHg  Pulse 58  Temp(Src) 97.7 F (36.5 C) (Oral)  Ht 5' 3" (1.6 m)  Wt 164 lb 12 oz (74.73 kg)  BMI 29.19 kg/m2  SpO2 96% Wt Readings from Last 3 Encounters:  07/28/14 164 lb 12 oz (74.73 kg)  07/23/13 158 lb 3.2 oz (71.759 kg)  07/16/12 159 lb 12.8 oz (72.485 kg)    Lab Results  Component Value Date   WBC 5.9 07/31/2013   HGB 14.4 07/31/2013   HCT 41.6 07/31/2013   PLT 163.0 07/31/2013   GLUCOSE 85 07/31/2013   CHOL 265* 07/31/2013   TRIG 129.0 07/31/2013   HDL 70.10 07/31/2013   LDLDIRECT 157.6 07/10/2012  LDLCALC 169* 07/31/2013   ALT 19 07/31/2013   AST 21 07/31/2013   NA 138 07/31/2013   K 3.8 07/31/2013   CL 104 07/31/2013   CREATININE 0.9 07/31/2013   BUN 18 07/31/2013   CO2 28 07/31/2013   TSH 1.73 07/31/2013    US Breast Left  02/02/2011   *RADIOLOGY REPORT*  Clinical Data:  The patient notes tender palpable mass within the upper-outer quadrant left breast.  There is a family history breast cancer in an aunt at age 88.  DIGITAL DIAGNOSTIC BILATERAL MAMMOGRAM WITH CAD AND LEFT BREAST ULTRASOUND:  Comparison:   08/27/2008, 08/19/2008, 02/11/2006.  Findings:  There is an extremely dense parenchymal pattern.  There is an oval, partially obscured mass located within the upper-outer quadrant of the left breast which by mammography measures approximately 4 cm in size.  There is no distortion or worrisome calcification within either breast.  There are scattered punctate calcifications noted bilaterally - stable. Mammographic images were processed with CAD.  On physical exam, there is a firm, tender, freely mobile, palpable mass located within the upper-outer quadrant of the left breast at the 2 o'clock position which by physical examination measures approximately 4 cm in size.  Ultrasound is performed, showing a cyst with low-level internal echoes and mild diffuse wall thickening located within the left breast at the 2 o'clock position 6 cm from nipple.  This corresponds to the palpable finding.  This measures maximally approximately 4 cm in size by ultrasound.  Ultrasound of the left axilla demonstrates several normal-appearing left axillary lymph nodes with normally preserved hilar regions.  Due to the complicated appearance of the cyst located within the left breast at 2 o'clock position (and tenderness) cyst aspiration is recommended.  This will be performed and reported separately.  IMPRESSION:  1.  Complicated 4 cm cyst located within the left breast at the 2 o'clock position corresponding to the palpable abnormality.  Cyst aspiration is recommended and this will be performed and reported separately.  BI-RADS CATEGORY 0:  Incomplete.  Need additional imaging evaluation and/or prior mammograms for comparison.  Original Report Authenticated By: Altamese Cabal, M.D.  US Aspiration  02/02/2011   *RADIOLOGY REPORT*  Clinical Data:  Tender complicated left breast cyst.  ULTRASOUND GUIDED ASPIRATION OF THE LEFT BREAST  I met with the patient, and we discussed the procedure of ultrasound-guided cyst aspiration, including  benefits and alternatives.  We discussed the high likelihood of a successful procedure. We discussed the risks of the procedure, including infection, bleeding, tissue injury, and inadequate sampling. Informed, written consent was given.  Using sterile technique,  2% lidocaine, ultrasound guidance, and 18 gauge spinal needle, aspiration was performed of the tender complicated cyst located within the left breast at the 2 o'clock position.  10 ml of nonbloody tan colored cyst fluid were removed. The cyst fully collapsed.  IMPRESSION: Ultrasound-guided aspiration of the complicated left breast cyst located at the 2 o'clock position.  Recommend follow-up left breast ultrasound in 3 months.  Breast self-examination was reviewed with the patient.  BI-RADS CATEGORY 3:  Probably benign finding(s) - short interval follow-up suggested.  Original Report Authenticated By: Altamese Cabal, M.D.  Mm Digital Diagnostic Bilat  02/02/2011   *RADIOLOGY REPORT*  Clinical Data:  The patient notes tender palpable mass within the upper-outer quadrant left breast.  There is a family history breast cancer in an aunt at age 45.  DIGITAL DIAGNOSTIC BILATERAL MAMMOGRAM WITH CAD AND LEFT BREAST ULTRASOUND:  Comparison:  08/27/2008,  08/19/2008, 02/11/2006.  Findings:  There is an extremely dense parenchymal pattern.  There is an oval, partially obscured mass located within the upper-outer quadrant of the left breast which by mammography measures approximately 4 cm in size.  There is no distortion or worrisome calcification within either breast.  There are scattered punctate calcifications noted bilaterally - stable. Mammographic images were processed with CAD.  On physical exam, there is a firm, tender, freely mobile, palpable mass located within the upper-outer quadrant of the left breast at the 2 o'clock position which by physical examination measures approximately 4 cm in size.  Ultrasound is performed, showing a cyst with low-level  internal echoes and mild diffuse wall thickening located within the left breast at the 2 o'clock position 6 cm from nipple.  This corresponds to the palpable finding.  This measures maximally approximately 4 cm in size by ultrasound.  Ultrasound of the left axilla demonstrates several normal-appearing left axillary lymph nodes with normally preserved hilar regions.  Due to the complicated appearance of the cyst located within the left breast at 2 o'clock position (and tenderness) cyst aspiration is recommended.  This will be performed and reported separately.  IMPRESSION:  1.  Complicated 4 cm cyst located within the left breast at the 2 o'clock position corresponding to the palpable abnormality.  Cyst aspiration is recommended and this will be performed and reported separately.  BI-RADS CATEGORY 0:  Incomplete.  Need additional imaging evaluation and/or prior mammograms for comparison.  Original Report Authenticated By: Altamese Cabal, M.D.      Assessment & Plan:   CPX/z00.00 - Patient has been counseled on age-appropriate routine health concerns for screening and prevention. These are reviewed and up-to-date. Immunizations are up-to-date or declined. Labs ordered and reviewed.  Problem List Items Addressed This Visit    Overweight    Wt Readings from Last 3 Encounters:  07/28/14 164 lb 12 oz (74.73 kg)  07/23/13 158 lb 3.2 oz (71.759 kg)  07/16/12 159 lb 12.8 oz (72.485 kg)   Body mass index is 29.19 kg/(m^2). The patient is asked to make an attempt to improve diet and exercise patterns to aid in medical management of this problem.        Other Visit Diagnoses    Routine general medical examination at a health care facility    -  Primary    Relevant Orders    Basic metabolic panel    CBC with Differential/Platelet    Hepatic function panel    Lipid panel    TSH    Urinalysis, Routine w reflex microscopic    Vit D  25 hydroxy (rtn osteoporosis monitoring)        Gwendolyn Grant, MD

## 2014-07-28 NOTE — Assessment & Plan Note (Signed)
Wt Readings from Last 3 Encounters:  07/28/14 164 lb 12 oz (74.73 kg)  07/23/13 158 lb 3.2 oz (71.759 kg)  07/16/12 159 lb 12.8 oz (72.485 kg)   Body mass index is 29.19 kg/(m^2). The patient is asked to make an attempt to improve diet and exercise patterns to aid in medical management of this problem.

## 2014-07-28 NOTE — Progress Notes (Signed)
Pre visit review using our clinic review tool, if applicable. No additional management support is needed unless otherwise documented below in the visit note. 

## 2014-07-30 ENCOUNTER — Other Ambulatory Visit (INDEPENDENT_AMBULATORY_CARE_PROVIDER_SITE_OTHER): Payer: 59

## 2014-07-30 DIAGNOSIS — Z0000 Encounter for general adult medical examination without abnormal findings: Secondary | ICD-10-CM | POA: Diagnosis not present

## 2014-07-30 LAB — URINALYSIS, ROUTINE W REFLEX MICROSCOPIC
Bilirubin Urine: NEGATIVE
HGB URINE DIPSTICK: NEGATIVE
KETONES UR: NEGATIVE
Leukocytes, UA: NEGATIVE
NITRITE: NEGATIVE
Specific Gravity, Urine: <=1.005 — AB (ref 1.000–1.030)
Total Protein, Urine: NEGATIVE
Urine Glucose: NEGATIVE
Urobilinogen, UA: 0.2 (ref 0.0–1.0)
pH: 6 (ref 5.0–8.0)

## 2014-07-30 LAB — CBC WITH DIFFERENTIAL/PLATELET
Basophils Absolute: 0 10*3/uL (ref 0.0–0.1)
Basophils Relative: 0.5 % (ref 0.0–3.0)
Eosinophils Absolute: 0.1 10*3/uL (ref 0.0–0.7)
Eosinophils Relative: 1.9 % (ref 0.0–5.0)
HCT: 39 % (ref 36.0–46.0)
HEMOGLOBIN: 13.5 g/dL (ref 12.0–15.0)
LYMPHS ABS: 2.8 10*3/uL (ref 0.7–4.0)
LYMPHS PCT: 38.7 % (ref 12.0–46.0)
MCHC: 34.6 g/dL (ref 30.0–36.0)
MCV: 87.3 fl (ref 78.0–100.0)
Monocytes Absolute: 0.5 10*3/uL (ref 0.1–1.0)
Monocytes Relative: 7.3 % (ref 3.0–12.0)
Neutro Abs: 3.7 10*3/uL (ref 1.4–7.7)
Neutrophils Relative %: 51.6 % (ref 43.0–77.0)
Platelets: 161 10*3/uL (ref 150.0–400.0)
RBC: 4.47 Mil/uL (ref 3.87–5.11)
RDW: 13.4 % (ref 11.5–15.5)
WBC: 7.1 10*3/uL (ref 4.0–10.5)

## 2014-07-30 LAB — LIPID PANEL
CHOL/HDL RATIO: 3
Cholesterol: 232 mg/dL — ABNORMAL HIGH (ref 0–200)
HDL: 72.4 mg/dL (ref >39.00)
LDL Cholesterol: 146 mg/dL — ABNORMAL HIGH (ref 0–99)
NonHDL: 159.6
Triglycerides: 70 mg/dL (ref 0.0–149.0)
VLDL: 14 mg/dL (ref 0.0–40.0)

## 2014-07-30 LAB — HEPATIC FUNCTION PANEL
ALBUMIN: 4.2 g/dL (ref 3.5–5.2)
ALT: 18 U/L (ref 0–35)
AST: 20 U/L (ref 0–37)
Alkaline Phosphatase: 61 U/L (ref 39–117)
Bilirubin, Direct: 0.2 mg/dL (ref 0.0–0.3)
TOTAL PROTEIN: 6.7 g/dL (ref 6.0–8.3)
Total Bilirubin: 0.9 mg/dL (ref 0.2–1.2)

## 2014-07-30 LAB — TSH: TSH: 1.64 u[IU]/mL (ref 0.35–4.50)

## 2014-07-30 LAB — BASIC METABOLIC PANEL
BUN: 17 mg/dL (ref 6–23)
CHLORIDE: 102 meq/L (ref 96–112)
CO2: 25 meq/L (ref 19–32)
Calcium: 9.3 mg/dL (ref 8.4–10.5)
Creatinine, Ser: 0.89 mg/dL (ref 0.40–1.20)
GFR: 70.37 mL/min (ref >60.00)
GLUCOSE: 86 mg/dL (ref 70–99)
Potassium: 4 mEq/L (ref 3.5–5.1)
Sodium: 137 mEq/L (ref 135–145)

## 2014-07-30 LAB — VITAMIN D 25 HYDROXY (VIT D DEFICIENCY, FRACTURES): VITD: 38.47 ng/mL (ref 30.00–100.00)

## 2014-10-06 ENCOUNTER — Other Ambulatory Visit: Payer: Self-pay | Admitting: Obstetrics and Gynecology

## 2014-10-07 LAB — CYTOLOGY - PAP

## 2014-10-12 ENCOUNTER — Other Ambulatory Visit: Payer: Self-pay | Admitting: Obstetrics and Gynecology

## 2014-10-12 DIAGNOSIS — R928 Other abnormal and inconclusive findings on diagnostic imaging of breast: Secondary | ICD-10-CM

## 2014-10-27 ENCOUNTER — Ambulatory Visit
Admission: RE | Admit: 2014-10-27 | Discharge: 2014-10-27 | Disposition: A | Discharge status: Home / Self Care | Payer: 59 | Source: Ambulatory Visit | Attending: Obstetrics and Gynecology | Admitting: Obstetrics and Gynecology

## 2014-10-27 DIAGNOSIS — R928 Other abnormal and inconclusive findings on diagnostic imaging of breast: Secondary | ICD-10-CM

## 2015-03-16 DIAGNOSIS — E559 Vitamin D deficiency, unspecified: Secondary | ICD-10-CM | POA: Diagnosis not present

## 2015-03-16 DIAGNOSIS — E785 Hyperlipidemia, unspecified: Secondary | ICD-10-CM | POA: Diagnosis not present

## 2015-03-16 DIAGNOSIS — E2839 Other primary ovarian failure: Secondary | ICD-10-CM | POA: Diagnosis not present

## 2015-03-16 DIAGNOSIS — E039 Hypothyroidism, unspecified: Secondary | ICD-10-CM | POA: Diagnosis not present

## 2015-03-16 MED FILL — SYNTHROID 25 MCG TABLET: 25 | 30 days supply | Qty: 30 | Fill #0

## 2015-04-12 MED FILL — SYNTHROID 25 MCG TABLET: 25 | 30 days supply | Qty: 30 | Fill #1

## 2015-04-28 DIAGNOSIS — E785 Hyperlipidemia, unspecified: Secondary | ICD-10-CM | POA: Diagnosis not present

## 2015-04-28 DIAGNOSIS — Z131 Encounter for screening for diabetes mellitus: Secondary | ICD-10-CM | POA: Diagnosis not present

## 2015-04-28 DIAGNOSIS — E559 Vitamin D deficiency, unspecified: Secondary | ICD-10-CM | POA: Diagnosis not present

## 2015-04-28 DIAGNOSIS — E039 Hypothyroidism, unspecified: Secondary | ICD-10-CM | POA: Diagnosis not present

## 2015-04-28 DIAGNOSIS — E2839 Other primary ovarian failure: Secondary | ICD-10-CM | POA: Diagnosis not present

## 2015-05-17 MED FILL — SYNTHROID 25 MCG TABLET: 25 | 30 days supply | Qty: 30 | Fill #2

## 2015-06-08 DIAGNOSIS — E559 Vitamin D deficiency, unspecified: Secondary | ICD-10-CM | POA: Diagnosis not present

## 2015-06-08 DIAGNOSIS — E785 Hyperlipidemia, unspecified: Secondary | ICD-10-CM | POA: Diagnosis not present

## 2015-06-08 DIAGNOSIS — E039 Hypothyroidism, unspecified: Secondary | ICD-10-CM | POA: Diagnosis not present

## 2015-06-08 DIAGNOSIS — E2839 Other primary ovarian failure: Secondary | ICD-10-CM | POA: Diagnosis not present

## 2015-07-13 MED FILL — SYNTHROID 25 MCG TABLET: 25 | 30 days supply | Qty: 30 | Fill #0

## 2015-08-03 ENCOUNTER — Encounter: Payer: 59 | Admitting: Internal Medicine

## 2015-08-22 MED FILL — SYNTHROID 25 MCG TABLET: 25 | 30 days supply | Qty: 30 | Fill #1

## 2015-09-13 DIAGNOSIS — E785 Hyperlipidemia, unspecified: Secondary | ICD-10-CM | POA: Diagnosis not present

## 2015-09-13 DIAGNOSIS — E2839 Other primary ovarian failure: Secondary | ICD-10-CM | POA: Diagnosis not present

## 2015-09-13 DIAGNOSIS — E559 Vitamin D deficiency, unspecified: Secondary | ICD-10-CM | POA: Diagnosis not present

## 2015-09-13 DIAGNOSIS — E039 Hypothyroidism, unspecified: Secondary | ICD-10-CM | POA: Diagnosis not present

## 2015-09-13 MED FILL — ESTRADIOL TDS 0.025 MG/DAY: 0.025 | 28 days supply | Qty: 4 | Fill #0

## 2015-09-16 MED FILL — SYNTHROID 25 MCG TABLET: 25 | 30 days supply | Qty: 30 | Fill #2

## 2015-09-19 DIAGNOSIS — E2839 Other primary ovarian failure: Secondary | ICD-10-CM | POA: Diagnosis not present

## 2015-10-10 MED FILL — SYNTHROID 25 MCG TABLET: 25 | 30 days supply | Qty: 30 | Fill #3

## 2015-10-10 MED FILL — ESTRADIOL TDS 0.025 MG/DAY: 0.025 | 28 days supply | Qty: 4 | Fill #1

## 2015-10-26 DIAGNOSIS — F5101 Primary insomnia: Secondary | ICD-10-CM | POA: Diagnosis not present

## 2015-10-26 DIAGNOSIS — E2839 Other primary ovarian failure: Secondary | ICD-10-CM | POA: Diagnosis not present

## 2015-11-10 MED FILL — ESTRADIOL TDS 0.025 MG/DAY: 0.025 | 28 days supply | Qty: 4 | Fill #2

## 2015-11-14 DIAGNOSIS — H2513 Age-related nuclear cataract, bilateral: Secondary | ICD-10-CM | POA: Diagnosis not present

## 2015-11-14 DIAGNOSIS — D3132 Benign neoplasm of left choroid: Secondary | ICD-10-CM | POA: Diagnosis not present

## 2015-11-14 DIAGNOSIS — H52223 Regular astigmatism, bilateral: Secondary | ICD-10-CM | POA: Diagnosis not present

## 2015-11-14 DIAGNOSIS — H524 Presbyopia: Secondary | ICD-10-CM | POA: Diagnosis not present

## 2015-11-14 DIAGNOSIS — H5203 Hypermetropia, bilateral: Secondary | ICD-10-CM | POA: Diagnosis not present

## 2015-11-28 MED FILL — SYNTHROID 25 MCG TABLET: 25 | 90 days supply | Qty: 90 | Fill #0

## 2015-12-05 MED FILL — ESTRADIOL TDS 0.025 MG/DAY: 0.025 | 28 days supply | Qty: 4 | Fill #3

## 2015-12-21 DIAGNOSIS — Z7189 Other specified counseling: Secondary | ICD-10-CM | POA: Diagnosis not present

## 2015-12-21 DIAGNOSIS — E2839 Other primary ovarian failure: Secondary | ICD-10-CM | POA: Diagnosis not present

## 2015-12-21 DIAGNOSIS — E785 Hyperlipidemia, unspecified: Secondary | ICD-10-CM | POA: Diagnosis not present

## 2015-12-21 DIAGNOSIS — E559 Vitamin D deficiency, unspecified: Secondary | ICD-10-CM | POA: Diagnosis not present

## 2015-12-21 DIAGNOSIS — E039 Hypothyroidism, unspecified: Secondary | ICD-10-CM | POA: Diagnosis not present

## 2015-12-21 DIAGNOSIS — Z713 Dietary counseling and surveillance: Secondary | ICD-10-CM | POA: Diagnosis not present

## 2016-01-02 MED FILL — ESTRADIOL TDS 0.025 MG/DAY: 0.025 | 28 days supply | Qty: 4 | Fill #4

## 2016-01-23 MED FILL — SYNTHROID 50 MCG TABLET: 50 | 30 days supply | Qty: 30 | Fill #0

## 2016-01-25 MED FILL — ESTRADIOL 0.025 MG/DAY PATC: 0.025 | 28 days supply | Qty: 4 | Fill #5

## 2016-02-08 DIAGNOSIS — Z131 Encounter for screening for diabetes mellitus: Secondary | ICD-10-CM | POA: Diagnosis not present

## 2016-02-08 DIAGNOSIS — R5383 Other fatigue: Secondary | ICD-10-CM | POA: Diagnosis not present

## 2016-02-08 DIAGNOSIS — E2839 Other primary ovarian failure: Secondary | ICD-10-CM | POA: Diagnosis not present

## 2016-02-08 DIAGNOSIS — E785 Hyperlipidemia, unspecified: Secondary | ICD-10-CM | POA: Diagnosis not present

## 2016-02-08 DIAGNOSIS — E559 Vitamin D deficiency, unspecified: Secondary | ICD-10-CM | POA: Diagnosis not present

## 2016-02-08 DIAGNOSIS — E039 Hypothyroidism, unspecified: Secondary | ICD-10-CM | POA: Diagnosis not present

## 2016-02-09 DIAGNOSIS — Z1231 Encounter for screening mammogram for malignant neoplasm of breast: Secondary | ICD-10-CM | POA: Diagnosis not present

## 2016-02-15 DIAGNOSIS — E785 Hyperlipidemia, unspecified: Secondary | ICD-10-CM | POA: Diagnosis not present

## 2016-02-15 DIAGNOSIS — Z0000 Encounter for general adult medical examination without abnormal findings: Secondary | ICD-10-CM | POA: Diagnosis not present

## 2016-02-15 DIAGNOSIS — E2839 Other primary ovarian failure: Secondary | ICD-10-CM | POA: Diagnosis not present

## 2016-02-15 DIAGNOSIS — Z0389 Encounter for observation for other suspected diseases and conditions ruled out: Secondary | ICD-10-CM | POA: Diagnosis not present

## 2016-02-15 DIAGNOSIS — E039 Hypothyroidism, unspecified: Secondary | ICD-10-CM | POA: Diagnosis not present

## 2016-02-19 MED FILL — SYNTHROID 50 MCG TABLET: 50 | 30 days supply | Qty: 30 | Fill #1

## 2016-02-22 DIAGNOSIS — Z01419 Encounter for gynecological examination (general) (routine) without abnormal findings: Secondary | ICD-10-CM | POA: Diagnosis not present

## 2016-02-22 DIAGNOSIS — Z6829 Body mass index (BMI) 29.0-29.9, adult: Secondary | ICD-10-CM | POA: Diagnosis not present

## 2016-03-08 MED FILL — ESTRADIOL 0.025 MG/DAY PATC: 0.025 | 28 days supply | Qty: 4 | Fill #0

## 2016-03-15 DIAGNOSIS — Z13820 Encounter for screening for osteoporosis: Secondary | ICD-10-CM | POA: Diagnosis not present

## 2016-03-15 MED FILL — SYNTHROID 50 MCG TABLET: 50 | 30 days supply | Qty: 30 | Fill #2

## 2016-04-09 MED FILL — ESTRADIOL 0.025 MG/DAY PATC: 0.025 | 28 days supply | Qty: 4 | Fill #1

## 2016-04-24 MED FILL — SYNTHROID 50 MCG TABLET: 50 | 30 days supply | Qty: 30 | Fill #3

## 2016-05-21 MED FILL — ESTRADIOL 0.025 MG/DAY PATC: 0.025 | 28 days supply | Qty: 4 | Fill #2

## 2016-05-23 MED FILL — SYNTHROID 50 MCG TABLET: 50 | 30 days supply | Qty: 30 | Fill #0

## 2016-06-18 MED FILL — SYNTHROID 50 MCG TABLET: 50 | 30 days supply | Qty: 30 | Fill #1

## 2016-06-18 MED FILL — ESTRADIOL 0.025 MG/DAY PATC: 0.025 | 28 days supply | Qty: 4 | Fill #3

## 2016-06-27 DIAGNOSIS — E289 Ovarian dysfunction, unspecified: Secondary | ICD-10-CM | POA: Diagnosis not present

## 2016-06-27 DIAGNOSIS — R42 Dizziness and giddiness: Secondary | ICD-10-CM | POA: Diagnosis not present

## 2016-06-27 DIAGNOSIS — E039 Hypothyroidism, unspecified: Secondary | ICD-10-CM | POA: Diagnosis not present

## 2016-07-10 MED FILL — ESTRADIOL 0.025 MG/DAY PATC: 0.025 | 28 days supply | Qty: 4 | Fill #4

## 2016-07-19 MED FILL — SYNTHROID 50 MCG TABLET: 50 | 90 days supply | Qty: 90 | Fill #0

## 2016-07-26 ENCOUNTER — Ambulatory Visit (INDEPENDENT_AMBULATORY_CARE_PROVIDER_SITE_OTHER): Payer: 59

## 2016-07-26 ENCOUNTER — Encounter: Payer: Self-pay | Admitting: Internal Medicine

## 2016-07-26 ENCOUNTER — Ambulatory Visit (INDEPENDENT_AMBULATORY_CARE_PROVIDER_SITE_OTHER): Payer: 59 | Admitting: Internal Medicine

## 2016-07-26 VITALS — BP 110/70 | HR 70 | Ht 63.0 in | Wt 169.0 lb

## 2016-07-26 DIAGNOSIS — R002 Palpitations: Secondary | ICD-10-CM

## 2016-07-26 DIAGNOSIS — R42 Dizziness and giddiness: Secondary | ICD-10-CM

## 2016-07-26 NOTE — Patient Instructions (Addendum)
Medication Instructions: Your physician recommends that you continue on your current medications as directed. Please refer to the Current Medication list given to you today.   Labwork: None Ordered  Procedures/Testing: Your physician has recommended that you wear a 15 day event monitor. Event monitors are medical devices that record the heart's electrical activity. Doctors most often [place] these monitors to diagnose arrhythmias. Arrhythmias are problems with the speed or rhythm of the heartbeat. The monitor is a small, portable device. You can wear one while you do your normal daily activities. This is usually used to diagnose what is causing palpitations/syncope (passing out).   Follow-Up: Your physician recommends that you schedule a follow-up appointment pending cardiac monitor results.   Any Additional Special Instructions Will Be Listed Below (If Applicable).     If you need a refill on your cardiac medications before your next appointment, please call your pharmacy.

## 2016-07-26 NOTE — Progress Notes (Signed)
ELECTROPHYSIOLOGY CONSULT NOTE  Patient ID: [name], MRN: [id number], DOB/AGE: 1960-07-16 56 y.o. Admit date: (Not on file) Date of Consult: 07/26/2016  Primary Physician: Doree Albee, MD  *   Glory Buff Huston is being seen today for the evaluation of spells at the request of Dr Anastasio Champion.   HPI ANNICE JOLLY is a 56 y.o. female  Referred for unusual spells of disequilibrium.  She is a case [facility]. Over the last 6 weeks she has had 2 unusual events. The first is that she will have spells when he tilts. It lasts 5-20 seconds. She feels the need to hold on although there is no sensation of motion. These can occur while standing or while seated. She's been observed at work by nurses. No one has commented on pallor. She has no history of orthostatic intolerance heat intolerance shower intolerance. She is quite fit without difficulties with exertion.   she is also has spells where her apple watch essentially her signals that her heart rates are running between 30 and 150. There is no recording of these events. Because of these association in time she is referred here. I'll write so you enjoy limitations it today and on      Past Medical History:  Diagnosis Date  . Allergic rhinitis, seasonal   . Hyperlipidemia       Surgical History:  Past Surgical History:  Procedure Laterality Date  . ABDOMINAL HYSTERECTOMY  2000  . CESAREAN SECTION     x's 2 93 & 95  . TMJ ARTHROPLASTY  1988     Home Meds: Prior to Admission medications   Medication Sig Start Date End Date Taking? Authorizing Provider  cholecalciferol (VITAMIN D) 1000 UNITS tablet Take 1,000 Units by mouth daily.   Yes [provider]  KRILL OIL 1000 MG CAPS Take by mouth daily.   Yes [provider]  levothyroxine (SYNTHROID, LEVOTHROID) 50 MCG tablet Take 50 mcg by mouth daily before breakfast.   Yes [provider]  Melatonin 5 MG TABS Take 1 tablet by  mouth daily at 10 pm.   Yes [provider]  Multiple Vitamin (MULTIVITAMIN) tablet Take 1 tablet by mouth daily.   Yes [provider]  Omega-3 Fatty Acids (FISH OIL) 1000 MG CPDR Take by mouth daily.   Yes [provider]    Allergies: No Known Allergies  Social History   Social History  . Marital status: Married    Spouse name: N/A  . Number of children: N/A  . Years of education: N/A   Occupational History  . Not on file.   Social History Main Topics  . Smoking status: Never Smoker  . Smokeless tobacco: Never Used  . Alcohol use 0.0 oz/week     Comment: occ  . Drug use: No  . Sexual activity: Not on file   Other Topics Concern  . 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 Melanoma Mother 67  . Hypertension Father   . Hyperlipidemia Father   . Dementia Maternal Grandmother 90  . Alcoholism Unknown        paternal side     ROS:  Please see the history of present illness.     All other systems reviewed and negative.  Physical Exam:  Blood pressure 110/70, pulse 70, height 5\' 3"  (1.6 m), weight 169 lb (76.7 kg), SpO2 99 %. General: Well developed, well nourished female in no acute distress. Head: Normocephalic, atraumatic, sclera non-icteric, no xanthomas, nares are without discharge. EENT: normal  Lymph Nodes:  none Neck: Negative for carotid bruits. JVD not elevated. Back:without scoliosis kyphosis  Lungs: Clear bilaterally to auscultation without wheezes, rales, or rhonchi. Breathing is unlabored. Heart: RRR with S1 S2. No   murmur . No rubs, or gallops appreciated. Abdomen: Soft, non-tender, non-distended with normoactive bowel sounds. No hepatomegaly. No rebound/guarding. No obvious abdominal masses. Msk:  Strength and tone appear normal for age. Extremities: No clubbing or cyanosis. No  edema.   Distal pedal pulses are 2+ and equal bilaterally. Skin: Warm and Dry Neuro: Alert and oriented X 3. CN III-XII intact Grossly normal sensory and motor function . Psych:  Responds to questions appropriately with a normal affect.      Labs: Cardiac Enzymes No results for input(s): CKTOTAL, CKMB, TROPONINI in the last 72 hours. CBC Lab Results  Component Value Date   WBC 7.1 07/30/2014   HGB 13.5 07/30/2014   HCT 39.0 07/30/2014   MCV 87.3 07/30/2014   PLT 161.0 07/30/2014   PROTIME: No results for input(s): LABPROT, INR in the last 72 hours. Chemistry No results for input(s): NA, K, CL, CO2, BUN, CREATININE, CALCIUM, PROT, BILITOT, ALKPHOS, ALT, AST, GLUCOSE in the last 168 hours.  Invalid input(s): LABALBU Lipids Lab Results  Component Value Date   CHOL 232 (H) 07/30/2014   HDL 72.40 07/30/2014   LDLCALC 146 (H) 07/30/2014   TRIG 70.0 07/30/2014   BNP No results found for: PROBNP Thyroid Function Tests: No results for input(s): TSH, T4TOTAL, T3FREE, THYROIDAB in the last 72 hours.  Invalid input(s): FREET3 Miscellaneous No results found for: DDIMER  Radiology/Studies:  No results found.  EKG: Sinus at 61 Intervals 17/08/43 Axis LXXV   Assessment and Plan:  Sporadic spells of disequilibrium  Variable heart rates   The patient spells or unusual. The disequilibrium is unassociated with pallor suggesting is not hypotensive. It is relatively brief and unassociated with epiphenomena again suggesting that it's not vasomotor. I wonder whether it is neurological/ENT.  Concurrent in time has been that her apple watch as alarmed with fast and slow heart rates. There have been no associated symptoms. This however has raised the issue as to whether these are related. I suspect not as does the patient.  However, a 14 day monitor should give [place] information both as to what the fast and slow heart rate alert R with a they are in fact real or falls, or whether there is any  arrhythmic association with these these equilibrium spells.      Virl Axe

## 2016-08-03 DIAGNOSIS — R55 Syncope and collapse: Secondary | ICD-10-CM | POA: Diagnosis not present

## 2016-08-09 DIAGNOSIS — R002 Palpitations: Secondary | ICD-10-CM | POA: Diagnosis not present

## 2016-08-09 DIAGNOSIS — R42 Dizziness and giddiness: Secondary | ICD-10-CM

## 2016-08-22 ENCOUNTER — Encounter: Payer: Self-pay | Admitting: Internal Medicine

## 2016-09-07 ENCOUNTER — Encounter: Payer: Self-pay | Admitting: *Deleted

## 2016-09-10 MED FILL — ESTRADIOL 0.025 MG/DAY PATC: 0.025 | 28 days supply | Qty: 4 | Fill #0

## 2016-10-03 MED FILL — SYNTHROID 50 MCG TABLET: 50 | 30 days supply | Qty: 30 | Fill #2

## 2016-10-04 MED FILL — ESTRADIOL 0.025 MG/DAY PATC: 0.025 | 28 days supply | Qty: 4 | Fill #1

## 2016-10-31 DIAGNOSIS — E2839 Other primary ovarian failure: Secondary | ICD-10-CM | POA: Diagnosis not present

## 2016-10-31 DIAGNOSIS — E785 Hyperlipidemia, unspecified: Secondary | ICD-10-CM | POA: Diagnosis not present

## 2016-10-31 DIAGNOSIS — R5383 Other fatigue: Secondary | ICD-10-CM | POA: Diagnosis not present

## 2016-10-31 DIAGNOSIS — E559 Vitamin D deficiency, unspecified: Secondary | ICD-10-CM | POA: Diagnosis not present

## 2016-10-31 DIAGNOSIS — E039 Hypothyroidism, unspecified: Secondary | ICD-10-CM | POA: Diagnosis not present

## 2016-11-13 MED FILL — ESTRADIOL 0.025 MG/DAY PATC: 0.025 | 28 days supply | Qty: 4 | Fill #2

## 2016-11-21 MED FILL — SYNTHROID 50 MCG TABLET: 50 | 90 days supply | Qty: 90 | Fill #1

## 2016-11-27 DIAGNOSIS — H04123 Dry eye syndrome of bilateral lacrimal glands: Secondary | ICD-10-CM | POA: Diagnosis not present

## 2016-11-27 DIAGNOSIS — H52223 Regular astigmatism, bilateral: Secondary | ICD-10-CM | POA: Diagnosis not present

## 2016-11-27 DIAGNOSIS — D3132 Benign neoplasm of left choroid: Secondary | ICD-10-CM | POA: Diagnosis not present

## 2016-11-27 DIAGNOSIS — H2513 Age-related nuclear cataract, bilateral: Secondary | ICD-10-CM | POA: Diagnosis not present

## 2016-11-27 DIAGNOSIS — H524 Presbyopia: Secondary | ICD-10-CM | POA: Diagnosis not present

## 2016-11-27 DIAGNOSIS — H5203 Hypermetropia, bilateral: Secondary | ICD-10-CM | POA: Diagnosis not present

## 2016-12-06 MED FILL — ESTRADIOL TDS 0.025 MG/DAY: 0.025 | 28 days supply | Qty: 4 | Fill #3

## 2016-12-28 MED FILL — ESTRADIOL TDS 0.025 MG/DAY: 0.025 | 28 days supply | Qty: 4 | Fill #4

## 2017-02-06 MED FILL — ESTRADIOL TDS 0.025 MG/DAY: 0.025 | 28 days supply | Qty: 4 | Fill #0

## 2017-02-07 DIAGNOSIS — E2839 Other primary ovarian failure: Secondary | ICD-10-CM | POA: Diagnosis not present

## 2017-02-07 DIAGNOSIS — E559 Vitamin D deficiency, unspecified: Secondary | ICD-10-CM | POA: Diagnosis not present

## 2017-02-07 DIAGNOSIS — E039 Hypothyroidism, unspecified: Secondary | ICD-10-CM | POA: Diagnosis not present

## 2017-02-22 MED FILL — SYNTHROID 75 MCG TABLET: 75 | 30 days supply | Qty: 30 | Fill #0

## 2017-03-13 MED FILL — ESTRADIOL TDS 0.025 MG/DAY: 0.025 | 28 days supply | Qty: 4 | Fill #1

## 2017-03-21 MED FILL — SYNTHROID 75 MCG TABLET: 75 | 30 days supply | Qty: 30 | Fill #1

## 2017-04-10 MED FILL — ESTRADIOL TDS 0.025 MG/DAY: 0.025 | 28 days supply | Qty: 4 | Fill #2

## 2017-04-16 MED FILL — SYNTHROID 75 MCG TABLET: 75 | 30 days supply | Qty: 30 | Fill #2

## 2017-05-08 MED FILL — ESTRADIOL TDS 0.025 MG/DAY: 0.025 | 28 days supply | Qty: 4 | Fill #3

## 2017-05-14 DIAGNOSIS — E2839 Other primary ovarian failure: Secondary | ICD-10-CM | POA: Diagnosis not present

## 2017-05-14 DIAGNOSIS — E039 Hypothyroidism, unspecified: Secondary | ICD-10-CM | POA: Diagnosis not present

## 2017-05-14 MED FILL — PROGESTERONE MICRONIZED 100: 100 | 30 days supply | Qty: 30 | Fill #0

## 2017-05-14 MED FILL — ESTRADIOL 0.5 MG TABS: 0.5 | 30 days supply | Qty: 30 | Fill #0

## 2017-05-14 MED FILL — ARMOUR THYROID 60 MG TABLET: 60 | 30 days supply | Qty: 30 | Fill #0

## 2017-06-10 MED FILL — ESTRADIOL 0.5 MG TABS: 0.5 | 30 days supply | Qty: 30 | Fill #1

## 2017-06-10 MED FILL — PROGESTERONE MICRONIZED 100: 100 | 30 days supply | Qty: 30 | Fill #1

## 2017-06-10 MED FILL — ARMOUR THYROID 60 MG TABLET: 60 | 30 days supply | Qty: 30 | Fill #1

## 2017-06-13 DIAGNOSIS — H00011 Hordeolum externum right upper eyelid: Secondary | ICD-10-CM | POA: Diagnosis not present

## 2017-06-13 MED FILL — ERYTHROMYCIN EYE OINTMENT: 5 | 20 days supply | Qty: 4 | Fill #0

## 2017-06-25 DIAGNOSIS — Z01419 Encounter for gynecological examination (general) (routine) without abnormal findings: Secondary | ICD-10-CM | POA: Diagnosis not present

## 2017-06-25 DIAGNOSIS — Z1231 Encounter for screening mammogram for malignant neoplasm of breast: Secondary | ICD-10-CM | POA: Diagnosis not present

## 2017-06-25 DIAGNOSIS — Z6828 Body mass index (BMI) 28.0-28.9, adult: Secondary | ICD-10-CM | POA: Diagnosis not present

## 2017-07-02 DIAGNOSIS — E785 Hyperlipidemia, unspecified: Secondary | ICD-10-CM | POA: Diagnosis not present

## 2017-07-02 DIAGNOSIS — R5383 Other fatigue: Secondary | ICD-10-CM | POA: Diagnosis not present

## 2017-07-02 DIAGNOSIS — E039 Hypothyroidism, unspecified: Secondary | ICD-10-CM | POA: Diagnosis not present

## 2017-07-02 DIAGNOSIS — E2839 Other primary ovarian failure: Secondary | ICD-10-CM | POA: Diagnosis not present

## 2017-07-03 DIAGNOSIS — E039 Hypothyroidism, unspecified: Secondary | ICD-10-CM | POA: Diagnosis not present

## 2017-07-03 DIAGNOSIS — R5383 Other fatigue: Secondary | ICD-10-CM | POA: Diagnosis not present

## 2017-07-03 DIAGNOSIS — E785 Hyperlipidemia, unspecified: Secondary | ICD-10-CM | POA: Diagnosis not present

## 2017-07-03 DIAGNOSIS — E2839 Other primary ovarian failure: Secondary | ICD-10-CM | POA: Diagnosis not present

## 2017-07-03 MED FILL — metroNIDAZOLE 500 MG TABS: 500 | 1 days supply | Qty: 4 | Fill #0

## 2017-07-04 MED FILL — ESTRADIOL 1 MG TABS: 1 | 30 days supply | Qty: 30 | Fill #0

## 2017-07-04 MED FILL — NP THYROID 30 MG TABLET: 30 | 30 days supply | Qty: 30 | Fill #0

## 2017-07-04 MED FILL — ARMOUR THYROID 120 MG TAB: 120 | 30 days supply | Qty: 30 | Fill #0

## 2017-07-04 MED FILL — PROGESTERONE MICRONIZED 200: 200 | 30 days supply | Qty: 30 | Fill #0

## 2017-07-31 MED FILL — ESTRADIOL 1 MG TABS: 1 | 30 days supply | Qty: 30 | Fill #1

## 2017-07-31 MED FILL — ARMOUR THYROID 120 MG TAB: 120 | 30 days supply | Qty: 30 | Fill #1

## 2017-07-31 MED FILL — PROGESTERONE MICRONIZED 200: 200 | 30 days supply | Qty: 30 | Fill #1

## 2017-07-31 MED FILL — NP THYROID 30 MG TABLET: 30 | 30 days supply | Qty: 30 | Fill #1

## 2017-08-21 DIAGNOSIS — R5383 Other fatigue: Secondary | ICD-10-CM | POA: Diagnosis not present

## 2017-08-21 DIAGNOSIS — E2839 Other primary ovarian failure: Secondary | ICD-10-CM | POA: Diagnosis not present

## 2017-08-21 DIAGNOSIS — E039 Hypothyroidism, unspecified: Secondary | ICD-10-CM | POA: Diagnosis not present

## 2017-08-22 DIAGNOSIS — E785 Hyperlipidemia, unspecified: Secondary | ICD-10-CM | POA: Diagnosis not present

## 2017-08-22 DIAGNOSIS — E2839 Other primary ovarian failure: Secondary | ICD-10-CM | POA: Diagnosis not present

## 2017-08-22 DIAGNOSIS — E039 Hypothyroidism, unspecified: Secondary | ICD-10-CM | POA: Diagnosis not present

## 2017-08-26 MED FILL — PROGESTERONE MICRONIZED 200: 200 | 30 days supply | Qty: 30 | Fill #2

## 2017-08-26 MED FILL — ARMOUR THYROID 120 MG TAB: 120 | 30 days supply | Qty: 30 | Fill #2

## 2017-08-26 MED FILL — ESTRADIOL 1 MG TABS: 1 | 30 days supply | Qty: 30 | Fill #2

## 2017-08-26 MED FILL — NP THYROID 30 MG TABLET: 30 | 30 days supply | Qty: 30 | Fill #2

## 2017-09-23 MED FILL — PROGESTERONE MICRONIZED 200: 200 | 30 days supply | Qty: 30 | Fill #3

## 2017-09-23 MED FILL — ARMOUR THYROID 120 MG TAB: 120 | 30 days supply | Qty: 30 | Fill #3

## 2017-09-23 MED FILL — ESTRADIOL 1 MG TABS: 1 | 30 days supply | Qty: 30 | Fill #3

## 2017-09-23 MED FILL — NP THYROID 30 MG TABLET: 30 | 30 days supply | Qty: 30 | Fill #3

## 2017-10-04 MED FILL — BD NEEDLES 25GX0.625: 25G X 5/8" | 50 days supply | Qty: 100 | Fill #0

## 2017-10-04 MED FILL — BD TB SYRINGE 27GX1/2: 27G X 1/2" | 50 days supply | Qty: 100 | Fill #0

## 2017-10-04 MED FILL — BD TB SYRINGE 27GX1/2": 27G X 1/2" | 50 days supply | Qty: 100 | Fill #0

## 2017-10-04 MED FILL — BD NEEDLES 25GX0.625": 25G X 5/8" | 50 days supply | Qty: 100 | Fill #0

## 2017-10-29 MED FILL — NP THYROID 30 MG TABLET: 30 | 30 days supply | Qty: 30 | Fill #0

## 2017-10-29 MED FILL — ARMOUR THYROID 120 MG TAB: 120 | 30 days supply | Qty: 30 | Fill #0

## 2017-10-29 MED FILL — ESTRADIOL 1 MG TABLET: 1 | 30 days supply | Qty: 30 | Fill #0

## 2017-10-29 MED FILL — PROGESTERONE MICRONIZED 200: 200 | 30 days supply | Qty: 30 | Fill #0

## 2017-11-28 DIAGNOSIS — E785 Hyperlipidemia, unspecified: Secondary | ICD-10-CM | POA: Diagnosis not present

## 2017-11-28 DIAGNOSIS — E039 Hypothyroidism, unspecified: Secondary | ICD-10-CM | POA: Diagnosis not present

## 2017-11-28 DIAGNOSIS — E559 Vitamin D deficiency, unspecified: Secondary | ICD-10-CM | POA: Diagnosis not present

## 2017-11-28 DIAGNOSIS — E2839 Other primary ovarian failure: Secondary | ICD-10-CM | POA: Diagnosis not present

## 2017-11-28 DIAGNOSIS — Z131 Encounter for screening for diabetes mellitus: Secondary | ICD-10-CM | POA: Diagnosis not present

## 2017-11-28 DIAGNOSIS — Z0000 Encounter for general adult medical examination without abnormal findings: Secondary | ICD-10-CM | POA: Diagnosis not present

## 2017-11-28 DIAGNOSIS — R5383 Other fatigue: Secondary | ICD-10-CM | POA: Diagnosis not present

## 2017-12-02 MED FILL — NP THYROID 30 MG TABLET: 30 | 30 days supply | Qty: 30 | Fill #1

## 2017-12-02 MED FILL — ARMOUR THYROID 120 MG TAB: 120 | 30 days supply | Qty: 30 | Fill #1

## 2017-12-02 MED FILL — ESTRADIOL 1 MG TABLET: 1 | 30 days supply | Qty: 30 | Fill #1

## 2017-12-02 MED FILL — PROGESTERONE 200 MG CAPSULE: 200 | 30 days supply | Qty: 30 | Fill #1

## 2017-12-10 DIAGNOSIS — H5203 Hypermetropia, bilateral: Secondary | ICD-10-CM | POA: Diagnosis not present

## 2017-12-10 DIAGNOSIS — H52223 Regular astigmatism, bilateral: Secondary | ICD-10-CM | POA: Diagnosis not present

## 2017-12-10 DIAGNOSIS — H524 Presbyopia: Secondary | ICD-10-CM | POA: Diagnosis not present

## 2018-01-02 MED FILL — ESTRADIOL 1 MG TABLET: 1 | 30 days supply | Qty: 30 | Fill #2

## 2018-01-02 MED FILL — PROGESTERONE 200 MG CAPSULE: 200 | 30 days supply | Qty: 30 | Fill #2

## 2018-01-02 MED FILL — NP THYROID 30 MG TABLET: 30 | 30 days supply | Qty: 30 | Fill #2

## 2018-01-02 MED FILL — ARMOUR THYROID 120 MG TAB: 120 | 30 days supply | Qty: 30 | Fill #2

## 2018-01-22 MED FILL — BENZONATATE 100 MG CAP: 100 | 15 days supply | Qty: 30 | Fill #0

## 2018-01-27 MED FILL — ARMOUR THYROID 120 MG TAB: 120 | 30 days supply | Qty: 30 | Fill #3

## 2018-01-27 MED FILL — NP THYROID 30 MG TABLET: 30 | 30 days supply | Qty: 30 | Fill #3

## 2018-01-27 MED FILL — ESTRADIOL 1 MG TABS: 1 | 30 days supply | Qty: 30 | Fill #3

## 2018-01-27 MED FILL — PROGESTERONE 200 MG CAPSULE: 200 | 30 days supply | Qty: 30 | Fill #3

## 2018-02-11 DIAGNOSIS — H05019 Cellulitis of unspecified orbit: Secondary | ICD-10-CM | POA: Diagnosis not present

## 2018-02-11 MED FILL — predniSONE 20 MG TABS: 20 | 5 days supply | Qty: 10 | Fill #0

## 2018-02-11 MED FILL — AMOX-CLAV 500-125 MG TABLET: 500-125 | 7 days supply | Qty: 14 | Fill #0

## 2018-02-15 ENCOUNTER — Emergency Department (HOSPITAL_COMMUNITY)
Admission: EM | Admit: 2018-02-15 | Discharge: 2018-02-15 | Disposition: A | Discharge status: Home / Self Care | Payer: 59 | Attending: Emergency Medicine | Admitting: Emergency Medicine

## 2018-02-15 ENCOUNTER — Encounter (HOSPITAL_COMMUNITY): Payer: Self-pay

## 2018-02-15 ENCOUNTER — Other Ambulatory Visit: Payer: Self-pay

## 2018-02-15 DIAGNOSIS — Z79899 Other long term (current) drug therapy: Secondary | ICD-10-CM | POA: Insufficient documentation

## 2018-02-15 DIAGNOSIS — R220 Localized swelling, mass and lump, head: Secondary | ICD-10-CM | POA: Insufficient documentation

## 2018-02-15 DIAGNOSIS — L989 Disorder of the skin and subcutaneous tissue, unspecified: Secondary | ICD-10-CM | POA: Insufficient documentation

## 2018-02-15 DIAGNOSIS — E785 Hyperlipidemia, unspecified: Secondary | ICD-10-CM | POA: Diagnosis not present

## 2018-02-15 HISTORY — DX: Disorder of thyroid, unspecified: E07.9

## 2018-02-15 MED ORDER — DIPHENHYDRAMINE HCL 25 MG PO CAPS
25.0000 mg | ORAL_CAPSULE | Freq: Once | ORAL | Status: AC
  Administered 2018-02-15: 25 mg via ORAL
  Filled 2018-02-15: qty 1

## 2018-02-15 NOTE — ED Triage Notes (Signed)
Patient c/o area around right eye that has increased redness and swelling x 4 days. patient saw her PCP and was given antibiotics. Today, the patient states she notices an odor, increased swelling and redness.

## 2018-02-15 NOTE — ED Provider Notes (Signed)
[facility] DEPT Provider Note   CSN: [id number] Arrival date & time: 02/15/18  0818     History   Chief Complaint Chief Complaint  Patient presents with  . Facial Swelling  . facial redness    right eye area    HPI [name] is a 57 y.o. female.  HPI  57 yo female with right periorbital swelling began last weekend.  She noted a lesion on the medial aspect right periorbital area.  She worked in the yard last Saturday and had two more lesions in the perioorbital area.  These itched a lot.  Took ibuprofen and benadryl without relief.  Saw Dr. Anastasio Champion on Tuesday and started augmentin and prednisone  on Wednesday. She has noted some decrease in swelling. She has had decrease swelling of "glands".  She reports that DR. Anastasio Champion was calling in levaquin yesterday but called to [facility] and she was unable to pick it up prior to pharmacy closing for weekend.  She was instructed by Dr. Anastasio Champion to come to ED if continued.   Past Medical History:  Diagnosis Date  . Allergic rhinitis, seasonal   . Hyperlipidemia   . Thyroid disease     Patient Active Problem List   Diagnosis Date Noted  . Overweight 07/28/2014  . Allergic rhinitis, seasonal   . HYPERLIPIDEMIA-MIXED 09/08/2009    Past Surgical History:  Procedure Laterality Date  . ABDOMINAL HYSTERECTOMY  2000  . CESAREAN SECTION     x's 2 93 & 95  . TMJ ARTHROPLASTY  1988     OB History   No obstetric history on file.      Home Medications    Prior to Admission medications   Medication Sig Start Date End Date Taking? Authorizing Provider  cholecalciferol (VITAMIN D) 1000 UNITS tablet Take 1,000 Units by mouth daily.    [provider]  KRILL OIL 1000 MG CAPS Take by mouth daily.    [provider]  levothyroxine (SYNTHROID, LEVOTHROID) 50 MCG tablet Take 50 mcg by mouth daily before breakfast.    [provider]  Melatonin 5 MG TABS Take 1 tablet by mouth daily at  10 pm.    [provider]  Multiple Vitamin (MULTIVITAMIN) tablet Take 1 tablet by mouth daily.    [provider]  Omega-3 Fatty Acids (FISH OIL) 1000 MG CPDR Take by mouth daily.    [provider]    Family History Family History  Problem Relation Age of Onset  . Melanoma Mother 65  . Hypertension Father   . Hyperlipidemia Father   . Dementia Maternal Grandmother 90  . Alcoholism Other        paternal side    Social History Social History   Tobacco Use  . Smoking status: Never Smoker  . Smokeless tobacco: Never Used  Substance Use Topics  . Alcohol use: Yes    Alcohol/week: 2.0 standard drinks    Types: 2 Standard drinks or equivalent per week    Comment: occ  . Drug use: No     Allergies   Patient has no known allergies.   Review of Systems Review of Systems  Constitutional: Negative for chills, fatigue and fever.  HENT: Positive for congestion and facial swelling. Negative for dental problem, ear pain, mouth sores, sore throat, trouble swallowing and voice change.   Eyes: Negative.   Respiratory: Negative.   Cardiovascular: Negative.   Gastrointestinal: Negative.   Endocrine: Negative.   Genitourinary: Negative.  Musculoskeletal: Positive for arthralgias.  Skin: Positive for rash.  Neurological: Negative.   Hematological: Negative.   Psychiatric/Behavioral: Negative.   All other systems reviewed and are negative.    Physical Exam Updated Vital Signs BP (!) 123/91 (BP Location: Right Arm)   Pulse 71   Temp 98 F (36.7 C) (Oral)   Resp 18   Ht 1.6 m (5\' 3" )   Wt 72.6 kg   SpO2 100%   BMI 28.34 kg/m   Physical Exam Vitals signs and nursing note reviewed.  Constitutional:      Appearance: Normal appearance. She is normal weight.  HENT:     Head: Atraumatic.     Right Ear: Tympanic membrane, ear canal and external ear normal.     Left Ear: Tympanic membrane, ear canal and external ear normal.     Nose: Nose normal.   Eyes:     Extraocular Movements: Extraocular movements intact.     Pupils: Pupils are equal, round, and reactive to light.  Neck:     Musculoskeletal: Normal range of motion.  Cardiovascular:     Rate and Rhythm: Normal rate and regular rhythm.  Pulmonary:     Effort: Pulmonary effort is normal.     Breath sounds: Normal breath sounds.  Abdominal:     General: Abdomen is flat. Bowel sounds are normal.     Palpations: Abdomen is soft.  Neurological:     Mental Status: She is alert.      ED Treatments / Results  Labs (all labs ordered are listed, but only abnormal results are displayed) Labs Reviewed - No data to display  EKG None  Radiology No results found.  Procedures Procedures (including critical care time)  Medications Ordered in ED Medications - No data to display   Initial Impression / Assessment and Plan / ED Course  I have reviewed the triage vital signs and the nursing notes.  Pertinent labs & imaging results that were available during my care of the patient were reviewed by me and considered in my medical decision making (see chart for details).     DDX Contact dermatitis Infection- including herpes, staph strep Autoimmune  Most c.w. contact otitis.  Advised patient to continue Augmentin and prednisone.  Will add Benadryl.  We have discussed return precautions including any problems with her airway or worsening signs of swelling or allergic reaction.  We also discussed signs of infection such as fever chills as reasons to return.  Otherwise she will continue her current medications and recheck with her primary care doctor on Monday   Final Clinical Impressions(s) / ED Diagnoses   Final diagnoses:  Facial swelling    ED Discharge Orders    None       Pattricia Boss, MD 02/15/18 1030

## 2018-02-15 NOTE — Discharge Instructions (Addendum)
Please continue current medications and add benadryl 25 mg every 6 hours  Return if worsening swelling, difficulty breathing, or fever

## 2018-03-04 MED FILL — ARMOUR THYROID 120 MG TAB: 120 | 30 days supply | Qty: 30 | Fill #0

## 2018-03-04 MED FILL — PROGESTERONE 200 MG CAPSULE: 200 | 30 days supply | Qty: 30 | Fill #0

## 2018-03-04 MED FILL — NP THYROID 30 MG TABLET: 30 | 30 days supply | Qty: 30 | Fill #0

## 2018-03-04 MED FILL — ESTRADIOL 1 MG TABS: 1 | 30 days supply | Qty: 30 | Fill #0

## 2018-04-02 MED FILL — ESTRADIOL 1 MG TABS: 1 | 30 days supply | Qty: 45 | Fill #0

## 2018-04-07 MED FILL — NP THYROID 120 MG TABLET: 120 | 30 days supply | Qty: 30 | Fill #0

## 2018-04-07 MED FILL — NP THYROID 30 MG TABLET: 30 | 30 days supply | Qty: 30 | Fill #1

## 2018-04-07 MED FILL — PROGESTERONE 200 MG CAPSULE: 200 | 30 days supply | Qty: 30 | Fill #1

## 2018-05-07 MED FILL — NP THYROID 120 MG TABLET: 120 | 30 days supply | Qty: 30 | Fill #1

## 2018-05-07 MED FILL — PROGESTERONE 200 MG CAPSULE: 200 | 30 days supply | Qty: 30 | Fill #2

## 2018-05-07 MED FILL — NP THYROID 30 MG TABLET: 30 | 30 days supply | Qty: 30 | Fill #2

## 2018-05-07 MED FILL — ESTRADIOL 1 MG TABS: 1 | 30 days supply | Qty: 45 | Fill #1

## 2018-05-12 ENCOUNTER — Encounter (INDEPENDENT_AMBULATORY_CARE_PROVIDER_SITE_OTHER): Payer: Self-pay | Admitting: Internal Medicine

## 2018-05-29 ENCOUNTER — Encounter (INDEPENDENT_AMBULATORY_CARE_PROVIDER_SITE_OTHER): Payer: Self-pay | Admitting: Internal Medicine

## 2018-06-02 MED FILL — NP THYROID 30 MG TABLET: 30 | 30 days supply | Qty: 30 | Fill #0

## 2018-06-02 MED FILL — NP THYROID 120 MG TABLET: 120 | 30 days supply | Qty: 30 | Fill #0

## 2018-06-02 MED FILL — PROGESTERONE 200 MG CAPSULE: 200 | 30 days supply | Qty: 30 | Fill #0

## 2018-06-02 MED FILL — ESTRADIOL 1 MG TABS: 1 | 30 days supply | Qty: 45 | Fill #2

## 2018-06-06 ENCOUNTER — Ambulatory Visit (INDEPENDENT_AMBULATORY_CARE_PROVIDER_SITE_OTHER): Payer: 59 | Admitting: Internal Medicine

## 2018-06-30 MED FILL — ESTRADIOL 2 MG TABLET: 2 | 30 days supply | Qty: 30 | Fill #0

## 2018-07-02 MED FILL — NP THYROID 120 MG TABLET: 120 | 30 days supply | Qty: 30 | Fill #1

## 2018-07-02 MED FILL — ESTRADIOL 1 MG TABS: 1 | 30 days supply | Qty: 45 | Fill #3

## 2018-07-02 MED FILL — PROGESTERONE 200 MG CAPSULE: 200 | 30 days supply | Qty: 30 | Fill #1

## 2018-07-02 MED FILL — NP THYROID 30 MG TABLET: 30 | 30 days supply | Qty: 30 | Fill #1

## 2018-07-26 MED FILL — PROGESTERONE 200 MG CAPSULE: 200 | 30 days supply | Qty: 30 | Fill #2

## 2018-07-26 MED FILL — NP THYROID 30 MG TABLET: 30 | 30 days supply | Qty: 30 | Fill #2

## 2018-07-26 MED FILL — NP THYROID 120 MG TABLET: 120 | 30 days supply | Qty: 30 | Fill #2

## 2018-07-29 MED FILL — ESTRADIOL 2 MG TABS: 2 | 30 days supply | Qty: 30 | Fill #0

## 2018-07-29 MED FILL — ESTRADIOL 1 MG TABS: 1 | 30 days supply | Qty: 30 | Fill #1

## 2018-08-22 MED FILL — ESTRADIOL 2 MG TABS: 2 | 30 days supply | Qty: 30 | Fill #1

## 2018-08-25 MED FILL — NP THYROID 120 MG TABLET: 120 | 30 days supply | Qty: 30 | Fill #0

## 2018-08-25 MED FILL — NP THYROID 30 MG TABLET: 30 | 30 days supply | Qty: 30 | Fill #0

## 2018-08-25 MED FILL — PROGESTERONE 200 MG CAPSULE: 200 | 30 days supply | Qty: 30 | Fill #0

## 2018-09-18 MED FILL — NP THYROID 30 MG TABLET: 30 | 30 days supply | Qty: 30 | Fill #1

## 2018-09-18 MED FILL — PROGESTERONE 200 MG CAPSULE: 200 | 30 days supply | Qty: 30 | Fill #1

## 2018-09-18 MED FILL — NP THYROID 120 MG TABLET: 120 | 30 days supply | Qty: 30 | Fill #1

## 2018-09-21 MED FILL — ESTRADIOL 2 MG TABS: 2 | 30 days supply | Qty: 30 | Fill #2

## 2018-10-18 MED FILL — PROGESTERONE 200 MG CAPSULE: 200 | 30 days supply | Qty: 30 | Fill #2

## 2018-10-18 MED FILL — NP THYROID 120 MG TABLET: 120 | 30 days supply | Qty: 30 | Fill #2

## 2018-10-18 MED FILL — NP THYROID 30 MG TABLET: 30 | 30 days supply | Qty: 30 | Fill #2

## 2018-10-21 MED FILL — ESTRADIOL 2 MG TABS: 2 | 30 days supply | Qty: 30 | Fill #3

## 2018-11-17 ENCOUNTER — Other Ambulatory Visit (INDEPENDENT_AMBULATORY_CARE_PROVIDER_SITE_OTHER): Payer: Self-pay

## 2018-11-17 MED ORDER — THYROID 30 MG PO TABS: 30.0000 mg | ORAL_TABLET | Freq: Every day | ORAL | 2 refills | Status: DC

## 2018-11-17 MED ORDER — THYROID 120 MG PO TABS: 120.0000 mg | ORAL_TABLET | Freq: Every day | ORAL | 2 refills | Status: DC

## 2018-11-17 MED FILL — NP THYROID 120 MG TABLET: 120 | 30 days supply | Qty: 30 | Fill #0

## 2018-11-17 MED FILL — NP THYROID 30 MG TABLET: 30 | 30 days supply | Qty: 30 | Fill #0

## 2018-11-18 ENCOUNTER — Other Ambulatory Visit (INDEPENDENT_AMBULATORY_CARE_PROVIDER_SITE_OTHER): Payer: Self-pay | Admitting: Internal Medicine

## 2018-11-20 ENCOUNTER — Other Ambulatory Visit (INDEPENDENT_AMBULATORY_CARE_PROVIDER_SITE_OTHER): Payer: Self-pay | Admitting: Internal Medicine

## 2018-11-20 MED ORDER — ESTRADIOL 2 MG PO TABS: 2.0000 mg | ORAL_TABLET | Freq: Every day | ORAL | 3 refills | Status: DC

## 2018-11-20 MED FILL — ESTRADIOL 2 MG TABLET: 2 | 30 days supply | Qty: 30 | Fill #1

## 2018-12-10 ENCOUNTER — Encounter (INDEPENDENT_AMBULATORY_CARE_PROVIDER_SITE_OTHER): Payer: 59 | Admitting: Internal Medicine

## 2018-12-13 MED FILL — NP THYROID 120 MG TABLET: 120 | 30 days supply | Qty: 30 | Fill #1

## 2018-12-13 MED FILL — NP THYROID 30 MG TABLET: 30 | 30 days supply | Qty: 30 | Fill #1

## 2018-12-15 ENCOUNTER — Ambulatory Visit (INDEPENDENT_AMBULATORY_CARE_PROVIDER_SITE_OTHER): Payer: No Typology Code available for payment source | Admitting: Internal Medicine

## 2018-12-15 ENCOUNTER — Other Ambulatory Visit: Payer: Self-pay

## 2018-12-15 ENCOUNTER — Encounter (INDEPENDENT_AMBULATORY_CARE_PROVIDER_SITE_OTHER): Payer: Self-pay | Admitting: Internal Medicine

## 2018-12-15 VITALS — BP 120/70 | HR 72 | Ht 63.0 in | Wt 162.0 lb

## 2018-12-15 DIAGNOSIS — E663 Overweight: Secondary | ICD-10-CM | POA: Diagnosis not present

## 2018-12-15 DIAGNOSIS — E782 Mixed hyperlipidemia: Secondary | ICD-10-CM

## 2018-12-15 DIAGNOSIS — E039 Hypothyroidism, unspecified: Secondary | ICD-10-CM | POA: Diagnosis not present

## 2018-12-15 DIAGNOSIS — Z131 Encounter for screening for diabetes mellitus: Secondary | ICD-10-CM

## 2018-12-15 DIAGNOSIS — Z1159 Encounter for screening for other viral diseases: Secondary | ICD-10-CM

## 2018-12-15 DIAGNOSIS — E2839 Other primary ovarian failure: Secondary | ICD-10-CM

## 2018-12-15 DIAGNOSIS — Z0001 Encounter for general adult medical examination with abnormal findings: Secondary | ICD-10-CM | POA: Diagnosis not present

## 2018-12-15 DIAGNOSIS — E559 Vitamin D deficiency, unspecified: Secondary | ICD-10-CM | POA: Diagnosis not present

## 2018-12-15 HISTORY — DX: Hypothyroidism, unspecified: E03.9

## 2018-12-15 HISTORY — DX: Vitamin D deficiency, unspecified: E55.9

## 2018-12-15 HISTORY — DX: Other primary ovarian failure: E28.39

## 2018-12-15 NOTE — Progress Notes (Signed)
Chief Complaint: This 58 year old lady comes in for an annual physical exam and to address her chronic conditions which are described below. HPI: She has a history of hypothyroidism for which she takes desiccated thyroid and seems to tolerate it well. She also has significant hyperlipidemia and we have not put her on statin therapy as she does not really want statin therapy and her HDL is in a good level historically.  She has no history of coronary artery disease or cerebrovascular disease or diabetes. She also has menopausal symptoms which are being treated with bioidentical hormone therapy as recorded below.   Past Medical History:  Diagnosis Date  . Allergic rhinitis, seasonal   . Hyperlipidemia   . Hypothyroidism, adult 12/15/2018  . 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 Thyroid disease   . Vitamin D deficiency disease 12/15/2018   Past Surgical History:  Procedure Laterality Date  . ABDOMINAL HYSTERECTOMY  2000  . CESAREAN SECTION     x's 2 93 & 95  . 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 Cholecalciferol (VITAMIN D) 125 MCG (5000 UT) CAPS Take 5,000 Units by mouth daily.   Marland Kitchen estradiol (ESTRACE) 2 MG tablet Take 1 tablet (2 mg total) by mouth daily.  Marland Kitchen KRILL OIL 1000 MG CAPS Take by mouth daily.  . Melatonin 5 MG TABS Take 1 tablet by mouth daily at 10 pm.  . Omega-3 Fatty Acids (FISH OIL) 1000 MG CPDR Take by mouth daily.  . progesterone (PROMETRIUM) 200 MG capsule Take 200 mg by mouth every evening.  . Testosterone 20 % CREA Apply 5 mg topically every other day.  . thyroid (NP THYROID) 120 MG tablet Take 1 tablet (120 mg total) by mouth daily before breakfast.  . thyroid (NP THYROID) 30 MG tablet Take 1 tablet (30 mg total) by mouth daily before breakfast.  . [DISCONTINUED] levothyroxine  (SYNTHROID, LEVOTHROID) 50 MCG tablet Take 50 mcg by mouth daily before breakfast.  . [DISCONTINUED] Multiple Vitamin (MULTIVITAMIN) tablet Take 1 tablet by mouth daily.     Nutrition She is doing reasonably well with nutrition with a combination of intermittent fasting and eating healthier.  Sleep Typically uninterrupted adequate sleep of 6 to 8 hours every night.  She does take melatonin which helps.  Exercise She is doing a lot of strength training at the present time at home and walks at least 30 minutes every day.  Bio-identical hormones Testosterone therapy is being used off label for symptoms of testosterone deficiency and benefits that it produces based on several studies.  These benefits include decreasing body fat, increasing in lean muscle mass and increasing in bone density.  There is improvement of memory, cognition.  There is improvement in exercise tolerance and endurance.  Testosterone therapy has also been shown to be protective against coronary artery disease, cerebrovascular disease, diabetes, hypertension and degenerative joint disease. I have discussed with the patient the FDA warnings regarding testosterone therapy, benefits and side effects and modes of administration as well as monitoring blood levels and side effects  on a regular basis The patient is agreeable that testosterone therapy should be an integral part of his/her wellness,quality of life and prevention of chronic disease.  Micronized progesterone is being used in this patient for multiple benefits based  on studies including protection against uterine cancer, breast cancer, osteoporosis and heart disease. The patient has been counseled regarding side effects, benefits and modes of administration. The patient is agreeable that this therapy is an integral part of her wellness, quality of life and prevention of chronic disease.  Estradiol is being used in this patient for multiple benefits based on several  studies including protection against heart disease, cerebrovascular disease, osteoporosis, colon cancer, Alzheimer's disease, macular degeneration and cataracts. The patient has been counseled regarding benefits and side effects and modes of administration. The patient is agreeable that this therapy is an integral to part of her wellness, quality of life and prevention of chronic disease.  [id number] from the symptoms mentioned above,there are no other symptoms referable to all systems reviewed.  Physical Exam: Blood pressure 120/70, pulse 72, height 5\' 3"  (1.6 m), weight 162 lb (73.5 kg). Vitals with BMI 12/15/2018 02/15/2018 07/26/2016  Height 5\' 3"  5\' 3"  5\' 3"   Weight 162 lbs 160 lbs 169 lbs  BMI 0000000 - 30  Systolic [id number]  Diastolic 70 91 70  Pulse 72 71 70      She looks systemically well. General: Alert, cooperative, and appears to be the stated age.No pallor.  No jaundice.  No clubbing. Head: Normocephalic Eyes: Sclera white, pupils equal and reactive to light, red reflex x 2,  Ears: Normal bilaterally Oral cavity: Lips, mucosa, and tongue normal: Teeth and gums normal Neck: No adenopathy, supple, symmetrical, trachea midline, and thyroid does not appear enlarged Breast: No masses felt. Respiratory: Clear to auscultation bilaterally.No wheezing, crackles or bronchial breathing. Cardiovascular: Heart sounds are present and appear to be normal without murmurs or added sounds.  No carotid bruits.  Peripheral pulses are present and equal bilaterally.: Gastrointestinal:positive bowel sounds, no hepatosplenomegaly.  No masses felt.No tenderness. Skin: Clear, No rashes noted.No worrisome skin lesions seen. Neurological: Grossly intact without focal findings, cranial nerves II through XII intact, muscle strength equal bilaterally Musculoskeletal: No acute joint abnormalities noted.Full range of movement noted with joints. Psychiatric: Affect appropriate, non-anxious.     Assessment  1. Mixed hyperlipidemia   2. Overweight   3. Hypothyroidism, adult   4. Vitamin D deficiency disease   5. Primary ovarian failure   6. Encounter for general adult medical examination with abnormal findings   7. Screening for diabetes mellitus   8. Encounter for hepatitis C screening test for low risk patient     Tests Ordered:   Orders Placed This Encounter  Procedures  . CBC  . COMPLETE METABOLIC PANEL WITH GFR  . Estradiol  . Hemoglobin A1c  . Hepatitis C antibody  . Lipid panel  . Progesterone  . T3, free  . TSH  . Testos,Total,Free and SHBG (Female)  . VITAMIN D 25 Hydroxy (Vit-D Deficiency, Fractures)     Plan  1. Healthy 58 year old lady. 2. Blood work is ordered above. 3. She will continue with desiccated thyroid for hypothyroidism and we will see if we need to further adjust doses. 4. Hyperlipidemia has not been treated with statin therapy and we will try and avoid this if possible. 5. She will continue with bioidentical hormone therapy for menopausal symptoms. 6. Further recommendations will depend on blood results and follow-up in 3 months time. 7. Today in addition to a preventative visit, I performed an office visit to address her chronic conditions above.     No orders of the defined types were placed in this encounter.    [name] C  [name]   12/15/2018, 3:15 PM

## 2018-12-18 LAB — COMPLETE METABOLIC PANEL WITH GFR
AG Ratio: 1.7 (calc) (ref 1.0–2.5)
ALT: 16 U/L (ref 6–29)
AST: 16 U/L (ref 10–35)
Albumin: 4.1 g/dL (ref 3.6–5.1)
Alkaline phosphatase (APISO): 56 U/L (ref 37–153)
BUN: 11 mg/dL (ref 7–25)
CO2: 28 mmol/L (ref 20–32)
Calcium: 9.4 mg/dL (ref 8.6–10.4)
Chloride: 103 mmol/L (ref 98–110)
Creat: 0.79 mg/dL (ref 0.50–1.05)
GFR, Est African American: 96 mL/min/{1.73_m2} (ref >=60)
GFR, Est Non African American: 83 mL/min/{1.73_m2} (ref >=60)
Globulin: 2.4 g/dL (calc) (ref 1.9–3.7)
Glucose, Bld: 105 mg/dL — ABNORMAL HIGH (ref 65–99)
Potassium: 3.6 mmol/L (ref 3.5–5.3)
Sodium: 141 mmol/L (ref 135–146)
Total Bilirubin: 0.6 mg/dL (ref 0.2–1.2)
Total Protein: 6.5 g/dL (ref 6.1–8.1)

## 2018-12-18 LAB — LIPID PANEL
Cholesterol: 266 mg/dL — ABNORMAL HIGH (ref <200)
HDL: 62 mg/dL (ref >=50)
LDL Cholesterol (Calc): 162 mg/dL (calc) — ABNORMAL HIGH
Non-HDL Cholesterol (Calc): 204 mg/dL (calc) — ABNORMAL HIGH (ref <130)
Total CHOL/HDL Ratio: 4.3 (calc) (ref <5.0)
Triglycerides: 245 mg/dL — ABNORMAL HIGH (ref <150)

## 2018-12-18 LAB — VITAMIN D 25 HYDROXY (VIT D DEFICIENCY, FRACTURES): Vit D, 25-Hydroxy: 56 ng/mL (ref 30–100)

## 2018-12-18 LAB — CBC
HCT: 42.7 % (ref 35.0–45.0)
Hemoglobin: 14 g/dL (ref 11.7–15.5)
MCH: 29.4 pg (ref 27.0–33.0)
MCHC: 32.8 g/dL (ref 32.0–36.0)
MCV: 89.7 fL (ref 80.0–100.0)
MPV: 10.8 fL (ref 7.5–12.5)
Platelets: 191 10*3/uL (ref 140–400)
RBC: 4.76 10*6/uL (ref 3.80–5.10)
RDW: 13.3 % (ref 11.0–15.0)
WBC: 6.7 10*3/uL (ref 3.8–10.8)

## 2018-12-18 LAB — HEMOGLOBIN A1C
Hgb A1c MFr Bld: 4.5 % of total Hgb (ref <5.7)
Mean Plasma Glucose: 82 (calc)
eAG (mmol/L): 4.6 (calc)

## 2018-12-18 LAB — TESTOS,TOTAL,FREE AND SHBG (FEMALE)
Free Testosterone: 0.7 pg/mL (ref 0.1–6.4)
Sex Hormone Binding: 181 nmol/L — ABNORMAL HIGH (ref 14–73)
Testosterone, Total, LC-MS-MS: 19 ng/dL (ref 2–45)

## 2018-12-18 LAB — TSH: TSH: 0.01 mIU/L — ABNORMAL LOW (ref 0.40–4.50)

## 2018-12-18 LAB — ESTRADIOL: Estradiol: 140 pg/mL

## 2018-12-18 LAB — T3, FREE: T3, Free: 5 pg/mL — ABNORMAL HIGH (ref 2.3–4.2)

## 2018-12-18 LAB — HEPATITIS C ANTIBODY
Hepatitis C Ab: NONREACTIVE
SIGNAL TO CUT-OFF: 0.02 (ref <1.00)

## 2018-12-18 LAB — PROGESTERONE: Progesterone: 3.7 ng/mL

## 2018-12-25 ENCOUNTER — Other Ambulatory Visit (INDEPENDENT_AMBULATORY_CARE_PROVIDER_SITE_OTHER): Payer: Self-pay | Admitting: Internal Medicine

## 2018-12-25 ENCOUNTER — Other Ambulatory Visit (INDEPENDENT_AMBULATORY_CARE_PROVIDER_SITE_OTHER): Payer: Self-pay

## 2018-12-25 ENCOUNTER — Encounter (INDEPENDENT_AMBULATORY_CARE_PROVIDER_SITE_OTHER): Payer: Self-pay | Admitting: Internal Medicine

## 2018-12-25 MED ORDER — PROGESTERONE MICRONIZED 200 MG PO CAPS: 200.0000 mg | ORAL_CAPSULE | Freq: Every evening | ORAL | 1 refills | Status: DC

## 2018-12-25 MED FILL — PROGESTERONE 200 MG CAPSULE: 200 | 90 days supply | Qty: 90 | Fill #0

## 2018-12-25 NOTE — Telephone Encounter (Signed)
Refill needed

## 2018-12-25 NOTE — Telephone Encounter (Signed)
OK FOR [facility] TO FILL.

## 2019-02-02 ENCOUNTER — Other Ambulatory Visit (INDEPENDENT_AMBULATORY_CARE_PROVIDER_SITE_OTHER): Payer: Self-pay | Admitting: Internal Medicine

## 2019-02-02 ENCOUNTER — Encounter (INDEPENDENT_AMBULATORY_CARE_PROVIDER_SITE_OTHER): Payer: Self-pay | Admitting: Internal Medicine

## 2019-02-02 MED ORDER — THYROID 120 MG PO TABS: 120.0000 mg | ORAL_TABLET | Freq: Every day | ORAL | 3 refills | Status: DC

## 2019-02-02 MED ORDER — PROGESTERONE MICRONIZED 200 MG PO CAPS: 400.0000 mg | ORAL_CAPSULE | Freq: Every evening | ORAL | 0 refills | Status: DC

## 2019-02-02 MED ORDER — ESTRADIOL 2 MG PO TABS: 2.0000 mg | ORAL_TABLET | Freq: Every day | ORAL | 0 refills | Status: DC

## 2019-02-02 MED ORDER — THYROID 30 MG PO TABS: 30.0000 mg | ORAL_TABLET | Freq: Every day | ORAL | 3 refills | Status: DC

## 2019-02-02 MED FILL — ESTRADIOL 2 MG TABS: 2 | 30 days supply | Qty: 30 | Fill #2

## 2019-02-09 ENCOUNTER — Other Ambulatory Visit (INDEPENDENT_AMBULATORY_CARE_PROVIDER_SITE_OTHER): Payer: Self-pay | Admitting: Internal Medicine

## 2019-02-09 MED FILL — NP THYROID 120 MG TABLET: 120 | 30 days supply | Qty: 30 | Fill #0

## 2019-02-09 MED FILL — NP THYROID 30 MG TABLET: 30 | 30 days supply | Qty: 30 | Fill #0

## 2019-02-10 MED FILL — PROGESTERONE 200 MG CAPSULE: 200 | 90 days supply | Qty: 180 | Fill #0

## 2019-03-05 MED FILL — ESTRADIOL 2 MG TABS: 2 | 30 days supply | Qty: 30 | Fill #3

## 2019-03-09 ENCOUNTER — Encounter (INDEPENDENT_AMBULATORY_CARE_PROVIDER_SITE_OTHER): Payer: Self-pay | Admitting: Internal Medicine

## 2019-03-19 ENCOUNTER — Ambulatory Visit (INDEPENDENT_AMBULATORY_CARE_PROVIDER_SITE_OTHER): Payer: No Typology Code available for payment source | Admitting: Internal Medicine

## 2019-04-07 MED FILL — NP THYROID 30 MG TABLET: 30 | 30 days supply | Qty: 30 | Fill #2

## 2019-04-07 MED FILL — NP THYROID 120 MG TABLET: 120 | 30 days supply | Qty: 30 | Fill #2

## 2019-04-08 MED FILL — ESTRADIOL 2 MG TABS: 2 | 90 days supply | Qty: 90 | Fill #0

## 2019-04-13 ENCOUNTER — Ambulatory Visit (INDEPENDENT_AMBULATORY_CARE_PROVIDER_SITE_OTHER): Payer: No Typology Code available for payment source | Admitting: Internal Medicine

## 2019-04-13 ENCOUNTER — Encounter (INDEPENDENT_AMBULATORY_CARE_PROVIDER_SITE_OTHER): Payer: Self-pay

## 2019-04-13 ENCOUNTER — Encounter (INDEPENDENT_AMBULATORY_CARE_PROVIDER_SITE_OTHER): Payer: Self-pay | Admitting: Internal Medicine

## 2019-04-13 ENCOUNTER — Other Ambulatory Visit: Payer: Self-pay

## 2019-04-13 VITALS — BP 130/70 | HR 80 | Temp 97.8°F | Ht 63.0 in | Wt 168.8 lb

## 2019-04-13 DIAGNOSIS — E039 Hypothyroidism, unspecified: Secondary | ICD-10-CM | POA: Diagnosis not present

## 2019-04-13 DIAGNOSIS — E2839 Other primary ovarian failure: Secondary | ICD-10-CM | POA: Diagnosis not present

## 2019-04-13 DIAGNOSIS — E782 Mixed hyperlipidemia: Secondary | ICD-10-CM | POA: Diagnosis not present

## 2019-04-13 DIAGNOSIS — E538 Deficiency of other specified B group vitamins: Secondary | ICD-10-CM

## 2019-04-13 NOTE — Progress Notes (Signed)
Metrics: Intervention Frequency ACO  Documented Smoking Status Yearly  Screened one or more times in 24 months  Cessation Counseling or  Active cessation medication Past 24 months  Past 24 months   Guideline developer: UpToDate (See UpToDate for funding source) Date Released: 2014       Wellness Office Visit  Subjective:  Patient ID: [name], female    DOB: 1961/01/20  Age: 59 y.o. MRN: [id number]  CC: This lady comes in for follow-up of management of her bioidentical hormone therapy, hypothyroidism and vitamin D deficiency. HPI  She has tolerated the higher dose of NP thyroid in the afternoon of 60 mg but she does not take it consistently because her perception of taking it always waiting 30 minutes before she eats. She has tolerated the higher dose of progesterone at night of 400 mg. She is also tolerating taking testosterone cream every day. She does not feel any different. She was concerned about B12 deficiency because she had genetic testing done which apparently predisposed her to have B12 deficiency. Past Medical History:  Diagnosis Date  . Allergic rhinitis, seasonal   . Hyperlipidemia   . Hypothyroidism, adult 12/15/2018  . 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 Thyroid disease   . Vitamin D deficiency disease 12/15/2018      Family History  Problem Relation Age of Onset  . Melanoma Mother 28  . Hypertension Father   . Hyperlipidemia Father   . Dementia Maternal Grandmother 90  . 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   Social History   Tobacco Use  . Smoking status: Never Smoker  . Smokeless tobacco: Never Used  Substance Use Topics  . Alcohol use: Yes    Alcohol/week: 2.0 standard drinks    Types: 2 Standard drinks or equivalent per week    Comment: occ    Current Meds  Medication Sig  . Cholecalciferol  (VITAMIN D) 125 MCG (5000 UT) CAPS Take 10,000 Units by mouth daily.   Marland Kitchen estradiol (ESTRACE) 2 MG tablet Take 1 tablet (2 mg total) by mouth daily.  . Melatonin 10 MG TABS Take 1 tablet by mouth daily at 10 pm.   . NP THYROID 60 MG tablet Take 60 mg by mouth daily before breakfast.  . Omega-3 Fatty Acids (FISH OIL) 1000 MG CPDR Take by mouth daily.  . progesterone (PROMETRIUM) 200 MG capsule Take 2 capsules (400 mg total) by mouth every evening.  . Testosterone 20 % CREA Apply 5 mg topically every other day.  . thyroid (NP THYROID) 120 MG tablet Take 1 tablet (120 mg total) by mouth daily before breakfast.      Objective:   Today's Vitals: There were no vitals taken for this visit. Vitals with BMI 04/13/2019 12/15/2018 02/15/2018  Height 5\' 3"  5\' 3"  5\' 3"   Weight 168 lbs 13 oz 162 lbs 160 lbs  BMI [id number] 0000000 -  Systolic [id number]  Diastolic 70 70 91  Pulse 80 72 71     Physical Exam   She looks systemically well.  She has gained about 6 pounds since the last visit.  Blood pressure is very stable.    Assessment   1. Primary ovarian failure   2. Hypothyroidism, adult   3. Mixed hyperlipidemia   4. B12 deficiency       Tests  ordered Orders Placed This Encounter  Procedures  . T3, free  . TSH  . Progesterone  . Estradiol  . Lipid panel  . B12 and Folate Panel     Plan: 1. She will continue with estradiol, progesterone and testosterone therapy. 2. She will continue with current dose of NP thyroid 120 mg in the morning and 60 mg at lunchtime and I have told her she can take it and then eat straight away as long as she does this every time. 3. Blood work is ordered which she will get done in about 2 weeks time. 4. Further recommendations will depend on blood results and I will see her in 3 months time for follow-up.   No orders of the defined types were placed in this encounter.   Doree Albee, MD

## 2019-05-05 ENCOUNTER — Other Ambulatory Visit (INDEPENDENT_AMBULATORY_CARE_PROVIDER_SITE_OTHER): Payer: Self-pay | Admitting: Internal Medicine

## 2019-05-05 MED FILL — PROGESTERONE 200 MG CAPSULE: 200 | 90 days supply | Qty: 180 | Fill #0

## 2019-05-05 MED FILL — NP THYROID 120 MG TABLET: 120 | 30 days supply | Qty: 30 | Fill #3

## 2019-05-05 MED FILL — NP THYROID 30 MG TABLET: 30 | 30 days supply | Qty: 30 | Fill #3

## 2019-05-20 LAB — LIPID PANEL
Cholesterol: 247 mg/dL — ABNORMAL HIGH (ref <200)
HDL: 47 mg/dL — ABNORMAL LOW (ref >=50)
LDL Cholesterol (Calc): 146 mg/dL (calc) — ABNORMAL HIGH
Non-HDL Cholesterol (Calc): 200 mg/dL (calc) — ABNORMAL HIGH (ref <130)
Total CHOL/HDL Ratio: 5.3 (calc) — ABNORMAL HIGH (ref <5.0)
Triglycerides: 349 mg/dL — ABNORMAL HIGH (ref <150)

## 2019-05-20 LAB — B12 AND FOLATE PANEL
Folate: 19.3 ng/mL
Vitamin B-12: 283 pg/mL (ref 200–1100)

## 2019-05-20 LAB — TSH: TSH: 0.01 mIU/L — ABNORMAL LOW (ref 0.40–4.50)

## 2019-05-20 LAB — T3, FREE: T3, Free: 4.1 pg/mL (ref 2.3–4.2)

## 2019-05-20 LAB — ESTRADIOL: Estradiol: 78 pg/mL

## 2019-05-20 LAB — PROGESTERONE: Progesterone: 12.4 ng/mL

## 2019-06-02 ENCOUNTER — Encounter (INDEPENDENT_AMBULATORY_CARE_PROVIDER_SITE_OTHER): Payer: Self-pay | Admitting: Internal Medicine

## 2019-06-02 ENCOUNTER — Other Ambulatory Visit (INDEPENDENT_AMBULATORY_CARE_PROVIDER_SITE_OTHER): Payer: Self-pay | Admitting: Internal Medicine

## 2019-06-02 MED ORDER — THYROID 90 MG PO TABS: 90.0000 mg | ORAL_TABLET | Freq: Every day | ORAL | 3 refills | Status: DC

## 2019-06-02 MED FILL — NP THYROID 90 MG TABLET: 90 | 30 days supply | Qty: 30 | Fill #0

## 2019-06-02 NOTE — Telephone Encounter (Signed)
Ok to increase dose thyroid.

## 2019-06-03 ENCOUNTER — Other Ambulatory Visit (INDEPENDENT_AMBULATORY_CARE_PROVIDER_SITE_OTHER): Payer: Self-pay | Admitting: Internal Medicine

## 2019-06-03 MED FILL — NP THYROID 120 MG TABLET: 120 | 30 days supply | Qty: 30 | Fill #0

## 2019-06-03 MED FILL — NP THYROID 30 MG TABLET: 30 | 30 days supply | Qty: 30 | Fill #0

## 2019-06-27 MED FILL — NP THYROID 90 MG TABLET: 90 | 30 days supply | Qty: 30 | Fill #1

## 2019-07-01 ENCOUNTER — Telehealth (INDEPENDENT_AMBULATORY_CARE_PROVIDER_SITE_OTHER): Payer: Self-pay

## 2019-07-01 ENCOUNTER — Other Ambulatory Visit (INDEPENDENT_AMBULATORY_CARE_PROVIDER_SITE_OTHER): Payer: Self-pay | Admitting: Internal Medicine

## 2019-07-01 MED ORDER — ESTRADIOL 2 MG PO TABS: 2.0000 mg | ORAL_TABLET | Freq: Every day | ORAL | 1 refills | Status: DC

## 2019-07-01 MED FILL — NP THYROID 120 MG TABLET: 120 | 30 days supply | Qty: 30 | Fill #1

## 2019-07-01 MED FILL — ESTRADIOL 2 MG TABS: 2 | 90 days supply | Qty: 90 | Fill #0

## 2019-07-01 NOTE — Telephone Encounter (Signed)
Estradiol

## 2019-07-01 NOTE — Telephone Encounter (Signed)
Which medication about refilling?

## 2019-07-16 ENCOUNTER — Other Ambulatory Visit: Payer: Self-pay

## 2019-07-16 ENCOUNTER — Ambulatory Visit (INDEPENDENT_AMBULATORY_CARE_PROVIDER_SITE_OTHER): Payer: No Typology Code available for payment source | Admitting: Internal Medicine

## 2019-07-16 ENCOUNTER — Encounter (INDEPENDENT_AMBULATORY_CARE_PROVIDER_SITE_OTHER): Payer: Self-pay | Admitting: Internal Medicine

## 2019-07-16 VITALS — BP 120/80 | HR 71 | Temp 97.5°F | Ht 63.0 in | Wt 167.6 lb

## 2019-07-16 DIAGNOSIS — E039 Hypothyroidism, unspecified: Secondary | ICD-10-CM

## 2019-07-16 DIAGNOSIS — E782 Mixed hyperlipidemia: Secondary | ICD-10-CM | POA: Diagnosis not present

## 2019-07-16 MED ORDER — FIRST-TESTOSTERONE MC 2 % TD CREA: 5.0000 mg | TOPICAL_CREAM | Freq: Every day | TRANSDERMAL | 2 refills | Status: DC

## 2019-07-16 NOTE — Progress Notes (Signed)
Metrics: Intervention Frequency ACO  Documented Smoking Status Yearly  Screened one or more times in 24 months  Cessation Counseling or  Active cessation medication Past 24 months  Past 24 months   Guideline developer: UpToDate (See UpToDate for funding source) Date Released: 2014       Wellness Office Visit  Subjective:  Patient ID: [name], female    DOB: [birth year]  Age: 59 y.o. MRN: [id number]  CC: This delightful lady comes in for follow-up of hypothyroidism, hyperlipidemia, menopausal state and bioidentical hormone therapy. HPI  She is doing reasonably well.  She tells me that since the last time I saw her, she began to eat somewhat poorly and gained weight but now she is back on track.  She tends to do intermittent fasting almost on a daily basis and that has 2 meals a day.  She tries to eat healthy.  She exercises on a regular basis. She tolerated the higher dose of NP thyroid from the last visit. She continues to take estradiol, progesterone but has not taken testosterone cream for several days. Past Medical History:  Diagnosis Date  . Allergic rhinitis, seasonal   . Hyperlipidemia   . Hypothyroidism, adult 12/15/2018  . 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 Thyroid disease   . Vitamin D deficiency disease 12/15/2018      Family History  Problem Relation Age of Onset  . Melanoma Mother 81  . Hypertension Father   . Hyperlipidemia Father   . Dementia Maternal Grandmother 90  . 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   Social History   Tobacco Use  . Smoking status: Never Smoker  . Smokeless tobacco: Never Used  Substance Use Topics  . Alcohol use: Yes    Alcohol/week: 2.0 standard drinks    Types: 2 Standard drinks or equivalent per week    Comment: occ    Current Meds  Medication Sig  . estradiol (ESTRACE) 2  MG tablet Take 1 tablet (2 mg total) by mouth daily.  . Melatonin 10 MG TABS Take 1 tablet by mouth daily at 10 pm.   . NP THYROID 120 MG tablet TAKE 1 TABLET BY MOUTH DAILY BEFORE BREAKFAST  . Omega-3 Fatty Acids (FISH OIL) 1000 MG CPDR Take by mouth daily.  . progesterone (PROMETRIUM) 200 MG capsule progesterone micronized 200 mg capsule  . Testosterone 20 % CREA Apply 5 mg topically every other day.  . thyroid (NP THYROID) 90 MG tablet Take 1 tablet (90 mg total) by mouth daily.       Objective:   Today's Vitals: BP 120/80 (BP Location: Right Arm, Patient Position: Sitting, Cuff Size: Normal)   Pulse 71   Temp (!) 97.5 F (36.4 C) (Temporal)   Ht 5\' 3"  (1.6 m)   Wt 167 lb 9.6 oz (76 kg)   SpO2 98%   BMI 29.69 kg/m  Vitals with BMI 07/16/2019 04/13/2019 04/13/2019  Height 5\' 3"  (No Data) 5\' 3"   Weight 167 lbs 10 oz (No Data) 168 lbs 13 oz  BMI [id number] - [id number]  Systolic 123456 (No Data) [id number]  Diastolic 80 (No Data) 70  Pulse 71 - 80     Physical Exam  She look systemically well.  Weight is stable.  Blood pressure is excellent.  No new physical findings.     Assessment  1. Mixed hyperlipidemia   2. Hypothyroidism, adult       Tests ordered Orders Placed This Encounter  Procedures  . T3, free  . Lipid panel     Plan: 1. Her hyperlipidemia has not required statin therapy and she would rather not be put on statin therapy and she is trying hard to manage with diet and exercise.  We will see what her lipid panel shows. 2. I will check a T3 level for her hypothyroidism and see if we need to adjust further her NP thyroid or if this will be a good number for her. 3. She will continue with bioidentical hormone therapy and I have called in another prescription for testosterone cream as this seems to have expired.   No orders of the defined types were placed in this encounter.   Doree Albee, MD

## 2019-07-17 LAB — T3, FREE: T3, Free: 10.2 pg/mL — ABNORMAL HIGH (ref 2.3–4.2)

## 2019-07-17 LAB — LIPID PANEL
Cholesterol: 235 mg/dL — ABNORMAL HIGH (ref <200)
HDL: 52 mg/dL (ref >=50)
LDL Cholesterol (Calc): 151 mg/dL (calc) — ABNORMAL HIGH
Non-HDL Cholesterol (Calc): 183 mg/dL (calc) — ABNORMAL HIGH (ref <130)
Total CHOL/HDL Ratio: 4.5 (calc) (ref <5.0)
Triglycerides: 188 mg/dL — ABNORMAL HIGH (ref <150)

## 2019-07-27 MED FILL — NP THYROID 90 MG TABLET: 90 | 30 days supply | Qty: 30 | Fill #2

## 2019-07-28 ENCOUNTER — Other Ambulatory Visit (INDEPENDENT_AMBULATORY_CARE_PROVIDER_SITE_OTHER): Payer: Self-pay | Admitting: Internal Medicine

## 2019-07-28 MED FILL — NP THYROID 120 MG TABLET: 120 | 30 days supply | Qty: 30 | Fill #2

## 2019-07-28 MED FILL — PROGESTERONE 200 MG CAPS: 200 | 90 days supply | Qty: 180 | Fill #0

## 2019-08-05 ENCOUNTER — Encounter (INDEPENDENT_AMBULATORY_CARE_PROVIDER_SITE_OTHER): Payer: Self-pay | Admitting: Internal Medicine

## 2019-08-25 MED FILL — NP THYROID 90 MG TABLET: 90 | 30 days supply | Qty: 30 | Fill #3

## 2019-08-26 MED FILL — NP THYROID 120 MG TABLET: 120 | 30 days supply | Qty: 30 | Fill #3

## 2019-09-23 MED FILL — ESTRADIOL 2 MG TABS: 2 | 30 days supply | Qty: 30 | Fill #1

## 2019-09-24 ENCOUNTER — Other Ambulatory Visit (INDEPENDENT_AMBULATORY_CARE_PROVIDER_SITE_OTHER): Payer: Self-pay | Admitting: Internal Medicine

## 2019-09-24 MED FILL — NP THYROID 90 MG TABLET: 90 | 90 days supply | Qty: 90 | Fill #0

## 2019-09-28 ENCOUNTER — Other Ambulatory Visit (INDEPENDENT_AMBULATORY_CARE_PROVIDER_SITE_OTHER): Payer: Self-pay

## 2019-09-28 ENCOUNTER — Other Ambulatory Visit (INDEPENDENT_AMBULATORY_CARE_PROVIDER_SITE_OTHER): Payer: Self-pay | Admitting: Internal Medicine

## 2019-09-28 MED ORDER — THYROID 60 MG PO TABS
120.0000 mg | ORAL_TABLET | Freq: Every day | ORAL | 1 refills | Status: DC
Start: 2019-09-28 — End: 2020-03-14

## 2019-09-28 MED FILL — NP THYROID 60 MG TABLET: 60 | 30 days supply | Qty: 60 | Fill #0

## 2019-09-28 NOTE — Telephone Encounter (Signed)
Please let the patient know that I have sent NP thyroid 60 mg tablets, take 2 daily to [facility].

## 2019-10-20 ENCOUNTER — Other Ambulatory Visit (INDEPENDENT_AMBULATORY_CARE_PROVIDER_SITE_OTHER): Payer: Self-pay | Admitting: Internal Medicine

## 2019-10-20 MED FILL — PROGESTERONE 200 MG CAPS: 200 | 90 days supply | Qty: 180 | Fill #0

## 2019-10-23 MED FILL — ESTRADIOL 2 MG TABS: 2 | 90 days supply | Qty: 90 | Fill #0

## 2019-10-26 MED FILL — NP THYROID 60 MG TABLET: 60 | 30 days supply | Qty: 60 | Fill #1

## 2019-11-21 MED FILL — NP THYROID 60 MG TABLET: 60 | 30 days supply | Qty: 60 | Fill #2

## 2019-12-16 ENCOUNTER — Encounter (INDEPENDENT_AMBULATORY_CARE_PROVIDER_SITE_OTHER): Payer: Self-pay | Admitting: Internal Medicine

## 2019-12-16 ENCOUNTER — Ambulatory Visit (INDEPENDENT_AMBULATORY_CARE_PROVIDER_SITE_OTHER): Payer: No Typology Code available for payment source | Admitting: Internal Medicine

## 2019-12-16 ENCOUNTER — Other Ambulatory Visit: Payer: Self-pay

## 2019-12-16 ENCOUNTER — Other Ambulatory Visit (INDEPENDENT_AMBULATORY_CARE_PROVIDER_SITE_OTHER): Payer: Self-pay | Admitting: Internal Medicine

## 2019-12-16 VITALS — BP 122/74 | HR 66 | Temp 96.6°F | Resp 18 | Ht 63.0 in | Wt 165.0 lb

## 2019-12-16 DIAGNOSIS — E782 Mixed hyperlipidemia: Secondary | ICD-10-CM | POA: Diagnosis not present

## 2019-12-16 DIAGNOSIS — Z0001 Encounter for general adult medical examination with abnormal findings: Secondary | ICD-10-CM

## 2019-12-16 DIAGNOSIS — E039 Hypothyroidism, unspecified: Secondary | ICD-10-CM

## 2019-12-16 DIAGNOSIS — E2839 Other primary ovarian failure: Secondary | ICD-10-CM | POA: Diagnosis not present

## 2019-12-16 DIAGNOSIS — Z1283 Encounter for screening for malignant neoplasm of skin: Secondary | ICD-10-CM | POA: Diagnosis not present

## 2019-12-16 MED ORDER — NONFORMULARY OR COMPOUNDED ITEM: 5.0000 mg | 3 refills | Status: DC

## 2019-12-16 MED ORDER — NP THYROID 120 MG PO TABS
120.0000 mg | ORAL_TABLET | Freq: Two times a day (BID) | ORAL | 1 refills | Status: DC
Start: 2019-12-16 — End: 2020-06-01

## 2019-12-16 MED FILL — NP THYROID 120 MG TABLET: 120 | 90 days supply | Qty: 180 | Fill #0

## 2019-12-16 NOTE — Progress Notes (Signed)
Chief Complaint: This 59 year old lady comes in for an annual physical exam and to address her chronic conditions which are described below. HPI: She is doing reasonably well.  She is finding that with testosterone cream, she gets acne and now she has reduced it to taking it 3 times a week. She continues on desiccated NP thyroid for her hypothyroidism. She has hyperlipidemia and will try to work with nutrition and exercise to help with this.  She does not really want to go on statin therapy and I do not agree with her. She continues with estradiol and progesterone without any problems.  Past Medical History:  Diagnosis Date  . Allergic rhinitis, seasonal   . Hyperlipidemia   . Hypothyroidism, adult 12/15/2018  . 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 Thyroid disease   . Vitamin D deficiency disease 12/15/2018   Past Surgical History:  Procedure Laterality Date  . ABDOMINAL HYSTERECTOMY  2000  . CESAREAN SECTION     x's 2 93 & 95  . 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    Social History   Tobacco Use  . Smoking status: Never Smoker  . Smokeless tobacco: Never Used  Substance Use Topics  . Alcohol use: Yes    Alcohol/week: 2.0 standard drinks    Types: 2 Standard drinks or equivalent per week    Comment: occ      Allergies: No Known Allergies   Current Meds  Medication Sig  . estradiol (ESTRACE) 2 MG tablet Take 1 tablet (2 mg total) by mouth daily.  . Melatonin 10 MG TABS Take 1 tablet by mouth daily at 10 pm.   . NP THYROID 90 MG tablet TAKE 1 TABLET BY MOUTH ONCE DAILY  . Omega-3 Fatty Acids (FISH OIL) 1000 MG CPDR Take by mouth daily.  . progesterone (PROMETRIUM) 200 MG capsule TAKE 2 CAPSULES BY MOUTH EVERY EVENING  . Testosterone 20 % CREA Apply 5 mg topically every other day.  . Testosterone  Propionate (FIRST-TESTOSTERONE MC) 2 % CREA Place 5 mg onto the skin daily.  Marland Kitchen thyroid (NP THYROID) 60 MG tablet Take 2 tablets (120 mg total) by mouth daily before breakfast.       Depression screen [facility] 2/9 12/16/2019  Decreased Interest 0  Down, Depressed, Hopeless 0  PHQ - 2 Score 0     [id number] from the symptoms mentioned above,there are no other symptoms referable to all systems reviewed.       Physical Exam: Blood pressure 122/74, pulse 66, temperature (!) 96.6 F (35.9 C), temperature source Temporal, resp. rate 18, height 5\' 3"  (1.6 m), weight 165 lb (74.8 kg), SpO2 98 %. Vitals with BMI 12/16/2019 07/16/2019 04/13/2019  Height 5\' 3"  5\' 3"  (No Data)  Weight 165 lbs 167 lbs 10 oz (No Data)  BMI 99.37 16.9 -  Systolic 678 938 (No Data)  Diastolic 74 80 (No Data)  Pulse 66 71 -  Chaperone present during full examination.   She looks systemically well.  Overweight.  Blood pressure excellent. General: Alert, cooperative, and appears to be the stated age.No pallor.  No jaundice.  No clubbing. Head: Normocephalic Eyes: Sclera white, pupils equal and reactive to light, red reflex x 2,  Ears: Normal bilaterally Oral cavity: Lips, mucosa, and tongue normal: Teeth and gums normal Neck: No adenopathy, supple,  symmetrical, trachea midline, and thyroid does not appear enlarged. Breast: No masses felt. Respiratory: Clear to auscultation bilaterally.No wheezing, crackles or bronchial breathing. Cardiovascular: Heart sounds are present and appear to be normal without murmurs or added sounds.  No carotid bruits.  Peripheral pulses are present and equal bilaterally.: Gastrointestinal:positive bowel sounds, no hepatosplenomegaly.  No masses felt.No tenderness. Skin: Clear, No rashes noted.No worrisome skin lesions seen, although I see a couple of spots that she would like to be evaluated on her left leg. Neurological: Grossly intact without focal findings, cranial nerves II through  XII intact, muscle strength equal bilaterally Musculoskeletal: No acute joint abnormalities noted.Full range of movement noted with joints. Psychiatric: Affect appropriate, non-anxious.    Assessment  1. Encounter for general adult medical examination with abnormal findings   2. Mixed hyperlipidemia   3. Skin cancer screening   4. Hypothyroidism, adult   5. Primary ovarian failure     Tests Ordered:   Orders Placed This Encounter  Procedures  . CT CARDIAC SCORING  . Ambulatory referral to Dermatology     Plan  1. Fairly healthy 59 year old lady. 2. She was keen to assess cardiac risk score and we will send her for CT cardiac scoring test. 3. We discussed her hypothyroidism and I think we can further optimize this so I am going to increase the dose of thyroid so that she will be taking NP thyroid 140 mg twice a day. 4. We discussed testosterone therapy again and, after shared decision making, she will go back on testosterone injections twice a week as I think this will produce less acne and give her beneficial effects.  Approximately a year ago, her total testosterone level was over 2000 but the free testosterone was at about 100.  I would like to optimize this further if possible to improve visceral fat and cardiac risk.  I have called in a prescription of testosterone cypionate injections 5 mg twice a week to [facility] in [place]. 5. We will not do blood work today but she will come back in about 3 months to see how she is doing and we will do the blood work then. 6. Today, in addition to a preventative visit, I performed an office visit to address her chronic conditions listed above.     Meds ordered this encounter  Medications  . NP THYROID 120 MG tablet    Sig: Take 1 tablet (120 mg total) by mouth 2 (two) times daily.    Dispense:  180 tablet    Refill:  1     [name] C Malarie Tappen   12/16/2019, 10:01 AM

## 2019-12-21 ENCOUNTER — Telehealth (INDEPENDENT_AMBULATORY_CARE_PROVIDER_SITE_OTHER): Payer: Self-pay

## 2019-12-21 NOTE — Telephone Encounter (Signed)
Please let the pharmacist know that the correct dose that the patient should be on now is NP thyroid 120 mg twice a day.  I just saw the patient for her annual physical and we agreed to increase his dose at this time.  The NP thyroid 90 mg and NP thyroid 60 mg is no longer the dose she is on.

## 2019-12-21 NOTE — Telephone Encounter (Signed)
[facility] and gave them correct dosage of NP Thyroid. Pharmacist verbalized an understanding.

## 2019-12-21 NOTE — Telephone Encounter (Signed)
[name] from [facility] called and wanted to know what dosage of NP Thyroid patient is taking? They received the refill request for the 120 mg BID and [name] is asking if patient is also taking the NP Thyroid 90mg  and the NP Thyroid 60 mg?   Please advise and I will call them and confirm correct dosage. Thank you!

## 2019-12-30 ENCOUNTER — Ambulatory Visit (HOSPITAL_COMMUNITY)
Admission: RE | Admit: 2019-12-30 | Discharge: 2019-12-30 | Disposition: A | Discharge status: Home / Self Care | Payer: Self-pay | Source: Ambulatory Visit | Attending: Internal Medicine | Admitting: Internal Medicine

## 2019-12-30 ENCOUNTER — Other Ambulatory Visit: Payer: Self-pay

## 2019-12-30 DIAGNOSIS — E782 Mixed hyperlipidemia: Secondary | ICD-10-CM | POA: Insufficient documentation

## 2020-01-12 ENCOUNTER — Other Ambulatory Visit (INDEPENDENT_AMBULATORY_CARE_PROVIDER_SITE_OTHER): Payer: Self-pay | Admitting: Internal Medicine

## 2020-01-12 ENCOUNTER — Telehealth (INDEPENDENT_AMBULATORY_CARE_PROVIDER_SITE_OTHER): Payer: Self-pay

## 2020-01-12 MED ORDER — PROGESTERONE 200 MG PO CAPS: ORAL_CAPSULE | ORAL | 0 refills | Status: DC

## 2020-01-12 MED FILL — PROGESTERONE 200 MG CAPS: 200 | 90 days supply | Qty: 180 | Fill #0

## 2020-01-12 NOTE — Telephone Encounter (Signed)
Received fax from [facility] requesting a refill for the following medication:  progesterone (PROMETRIUM) 200 MG capsule Last filled 10/20/2019, # 180 with 0 refills  Last OV 12/16/2019

## 2020-01-15 MED FILL — ESTRADIOL 2 MG TABS: 2 | 90 days supply | Qty: 90 | Fill #1

## 2020-02-26 ENCOUNTER — Other Ambulatory Visit (INDEPENDENT_AMBULATORY_CARE_PROVIDER_SITE_OTHER): Payer: Self-pay | Admitting: Internal Medicine

## 2020-02-26 ENCOUNTER — Encounter (INDEPENDENT_AMBULATORY_CARE_PROVIDER_SITE_OTHER): Payer: Self-pay | Admitting: Internal Medicine

## 2020-03-04 MED ORDER — AMOXICILLIN-POT CLAVULANATE 875-125 MG PO TABS: 1.0000 | ORAL_TABLET | Freq: Two times a day (BID) | ORAL | 0 refills | Status: DC

## 2020-03-04 MED FILL — AMOX-CLAV 875-125 MG TABLET: 875-125 | 10 days supply | Qty: 20 | Fill #0

## 2020-03-09 MED FILL — NP THYROID 120 MG TABLET: 120 | 90 days supply | Qty: 180 | Fill #1

## 2020-03-14 ENCOUNTER — Other Ambulatory Visit: Payer: Self-pay

## 2020-03-14 ENCOUNTER — Encounter (INDEPENDENT_AMBULATORY_CARE_PROVIDER_SITE_OTHER): Payer: Self-pay | Admitting: Internal Medicine

## 2020-03-14 ENCOUNTER — Other Ambulatory Visit (INDEPENDENT_AMBULATORY_CARE_PROVIDER_SITE_OTHER): Payer: Self-pay | Admitting: Internal Medicine

## 2020-03-14 ENCOUNTER — Ambulatory Visit (INDEPENDENT_AMBULATORY_CARE_PROVIDER_SITE_OTHER): Payer: No Typology Code available for payment source | Admitting: Internal Medicine

## 2020-03-14 VITALS — BP 126/74 | HR 70 | Temp 97.9°F | Ht 63.0 in | Wt 160.4 lb

## 2020-03-14 DIAGNOSIS — S0300XA Dislocation of jaw, unspecified side, initial encounter: Secondary | ICD-10-CM | POA: Diagnosis not present

## 2020-03-14 MED ORDER — PREDNISONE 20 MG PO TABS: 40.0000 mg | ORAL_TABLET | Freq: Every day | ORAL | 1 refills | Status: DC

## 2020-03-14 MED ORDER — DEXAMETHASONE SODIUM PHOSPHATE 4 MG/ML IJ SOLN
4.0000 mg | Freq: Once | INTRAMUSCULAR | Status: AC
  Administered 2020-03-14: 4 mg via INTRAMUSCULAR

## 2020-03-14 MED FILL — predniSONE 20 MG TABS: 20 | 5 days supply | Qty: 10 | Fill #0

## 2020-03-14 NOTE — Addendum Note (Signed)
Addended by: Anibal Henderson on: 03/14/2020 04:22 PM   Modules accepted: Orders

## 2020-03-14 NOTE — Progress Notes (Signed)
Metrics: Intervention Frequency ACO  Documented Smoking Status Yearly  Screened one or more times in 24 months  Cessation Counseling or  Active cessation medication Past 24 months  Past 24 months   Guideline developer: UpToDate (See UpToDate for funding source) Date Released: 2014       Wellness Office Visit  Subjective:  Patient ID: [name], female    DOB: 29-Aug-1960  Age: 60 y.o. MRN: [id number]  CC: Right jaw pain HPI  This lady comes in as an acute visit with a 24 history of right temporomandibular area jaw pain.  She previously had surgery in this area a couple of decades ago.  She is now unable to eat properly and is only able to take liquid foods.  She has been taking Tylenol alternating with ibuprofen.  She continues to have pain. Past Medical History:  Diagnosis Date  . Allergic rhinitis, seasonal   . Hyperlipidemia   . Hypothyroidism, adult 12/15/2018  . 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 Thyroid disease   . Vitamin D deficiency disease 12/15/2018   Past Surgical History:  Procedure Laterality Date  . ABDOMINAL HYSTERECTOMY  2000  . CESAREAN SECTION     x's 2 93 & 95  . TMJ ARTHROPLASTY  1988     Family History  Problem Relation Age of Onset  . Melanoma Mother 77  . Hypertension Father   . Hyperlipidemia Father   . Dementia Maternal Grandmother 90  . 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   Social History   Tobacco Use  . Smoking status: Never Smoker  . Smokeless tobacco: Never Used  Substance Use Topics  . Alcohol use: Yes    Alcohol/week: 2.0 standard drinks    Types: 2 Standard drinks or equivalent per week    Comment: occ    Current Meds  Medication Sig  . estradiol (ESTRACE) 2 MG tablet Take 1 tablet (2 mg total) by mouth daily.  . Melatonin 10 MG TABS Take 1  tablet by mouth daily at 10 pm.   . NONFORMULARY OR COMPOUNDED ITEM Inject 5 mg into the muscle 2 (two) times a week. Testosterone cypionate in olive  oil (25 mg/mL).  Dispense 5 mL vial.  . NP THYROID 120 MG tablet Take 1 tablet (120 mg total) by mouth 2 (two) times daily.  . Omega-3 Fatty Acids (FISH OIL) 1000 MG CPDR Take by mouth daily.  . predniSONE (DELTASONE) 20 MG tablet Take 2 tablets (40 mg total) by mouth daily with breakfast.  . progesterone (PROMETRIUM) 200 MG capsule TAKE 2 CAPSULES BY MOUTH EVERY EVENING      Depression screen [facility] 2/9 12/16/2019  Decreased Interest 0  Down, Depressed, Hopeless 0  PHQ - 2 Score 0     Objective:   Today's Vitals: BP 126/74   Pulse 70   Temp 97.9 F (36.6 C) (Temporal)   Ht 5\' 3"  (1.6 m)   Wt 160 lb 6.4 oz (72.8 kg)   SpO2 96%   BMI 28.41 kg/m  Vitals with BMI 03/14/2020 12/16/2019 07/16/2019  Height 5\' 3"  5\' 3"  5\' 3"   Weight 160 lbs 6 oz 165 lbs 167 lbs 10 oz  BMI 28.42 11.91 47.8  Systolic 295 621 308  Diastolic 74 74 80  Pulse 70 66  71     Physical Exam  Her throat appears to have no inflammation.  Both tympanic membranes are not particularly inflamed.  There is no neck lymphadenopathy.  Parotid gland on the right side does not appear to be inflamed/swollen/enlarged.  However she does have acute tenderness in the right temporomandibular joint area.     Assessment   1. Dislocation of temporomandibular joint, initial encounter       Tests ordered Orders Placed This Encounter  Procedures  . Ambulatory referral to Oral Maxillofacial Surgery     Plan: 1. I will treat her with dexamethasone 4 mg in the office and prednisone 40 mg daily which she can start tomorrow morning for 5 days.  I will refer also to oral maxillofacial surgery for further evaluation and treatment.   Meds ordered this encounter  Medications  . predniSONE (DELTASONE) 20 MG tablet    Sig: Take 2 tablets (40 mg total) by mouth daily with  breakfast.    Dispense:  10 tablet    Refill:  1    Tayshaun Kroh Luther Parody, MD

## 2020-03-16 ENCOUNTER — Encounter (INDEPENDENT_AMBULATORY_CARE_PROVIDER_SITE_OTHER): Payer: Self-pay | Admitting: Internal Medicine

## 2020-03-18 MED FILL — predniSONE 20 MG TABS: 20 | 5 days supply | Qty: 10 | Fill #1

## 2020-03-28 ENCOUNTER — Other Ambulatory Visit: Payer: Self-pay

## 2020-03-28 ENCOUNTER — Ambulatory Visit (INDEPENDENT_AMBULATORY_CARE_PROVIDER_SITE_OTHER): Payer: No Typology Code available for payment source | Admitting: Internal Medicine

## 2020-03-28 ENCOUNTER — Encounter (INDEPENDENT_AMBULATORY_CARE_PROVIDER_SITE_OTHER): Payer: Self-pay | Admitting: Internal Medicine

## 2020-03-28 VITALS — BP 122/80 | HR 86 | Temp 97.5°F | Resp 18 | Ht 63.0 in | Wt 165.4 lb

## 2020-03-28 DIAGNOSIS — E039 Hypothyroidism, unspecified: Secondary | ICD-10-CM | POA: Diagnosis not present

## 2020-03-28 DIAGNOSIS — E2839 Other primary ovarian failure: Secondary | ICD-10-CM | POA: Diagnosis not present

## 2020-03-28 DIAGNOSIS — E782 Mixed hyperlipidemia: Secondary | ICD-10-CM | POA: Diagnosis not present

## 2020-03-28 MED ORDER — NONFORMULARY OR COMPOUNDED ITEM: 5.0000 mg | 3 refills | Status: DC

## 2020-03-28 NOTE — Progress Notes (Signed)
Metrics: Intervention Frequency ACO  Documented Smoking Status Yearly  Screened one or more times in 24 months  Cessation Counseling or  Active cessation medication Past 24 months  Past 24 months   Guideline developer: UpToDate (See UpToDate for funding source) Date Released: 2014       Wellness Office Visit  Subjective:  Patient ID: [name], female    DOB: February 05, 1961  Age: 60 y.o. MRN: [id number]  CC: This lady comes in for follow-up regarding her right jaw pain as well as her chronic conditions which include hypothyroidism, dyslipidemia and menopausal symptoms. HPI  Since last time I saw her, and started on steroids, thankfully her symptoms of completely resolved and she can eat now.  Unfortunately, she has not seen the oral surgeon yet and she will pursue this by herself.  We have made the referral but she did not get an appointment unfortunately. Past Medical History:  Diagnosis Date  . Allergic rhinitis, seasonal   . Hyperlipidemia   . Hypothyroidism, adult 12/15/2018  . 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 Thyroid disease   . Vitamin D deficiency disease 12/15/2018   Past Surgical History:  Procedure Laterality Date  . ABDOMINAL HYSTERECTOMY  2000  . CESAREAN SECTION     x's 2 93 & 95  . TMJ ARTHROPLASTY  1988     Family History  Problem Relation Age of Onset  . Melanoma Mother 35  . Hypertension Father   . Hyperlipidemia Father   . Dementia Maternal Grandmother 90  . 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   Social History   Tobacco Use  . Smoking status: Never Smoker  . Smokeless tobacco: Never Used  Substance Use Topics  . Alcohol use: Yes    Alcohol/week: 2.0 standard drinks    Types: 2 Standard drinks or equivalent per week    Comment: occ    Current Meds  Medication  Sig  . estradiol (ESTRACE) 2 MG tablet Take 1 tablet (2 mg total) by mouth daily.  . Melatonin 10 MG TABS Take 1 tablet by mouth daily at 10 pm.   . NONFORMULARY OR COMPOUNDED ITEM Inject 5 mg into the muscle 2 (two) times a week. Testosterone cypionate in olive  oil (25 mg/mL).  Dispense 5 mL vial.  . NONFORMULARY OR COMPOUNDED ITEM Inject 5 mg into the muscle 2 (two) times a week. Testosterone cypionate in olive  oil (25 mg/mL).  Dispense 5 mL vial.  . NP THYROID 120 MG tablet Take 1 tablet (120 mg total) by mouth 2 (two) times daily.  . Omega-3 Fatty Acids (FISH OIL) 1000 MG CPDR Take by mouth daily.  . progesterone (PROMETRIUM) 200 MG capsule TAKE 2 CAPSULES BY MOUTH EVERY EVENING  . [DISCONTINUED] Testosterone 20 % CREA Apply 5 mg topically every other day.      Depression screen [facility] 2/9 12/16/2019  Decreased Interest 0  Down, Depressed, Hopeless 0  PHQ - 2 Score 0     Objective:   Today's Vitals: BP 122/80 (BP Location: Right Arm, Patient Position: Sitting, Cuff Size: Normal)   Pulse 86   Temp (!) 97.5 F (36.4 C) (Temporal)   Resp 18   Ht 5\' 3"  (1.6 m)   Wt 165 lb 6.4 oz (75 kg)   SpO2  98%   BMI 29.30 kg/m  Vitals with BMI 03/28/2020 03/14/2020 12/16/2019  Height 5\' 3"  5\' 3"  5\' 3"   Weight 165 lbs 6 oz 160 lbs 6 oz 165 lbs  BMI 29.31 24.40 10.27  Systolic 253 664 403  Diastolic 80 74 74  Pulse 86 70 66     Physical Exam  She has gained about 5 pounds since the last time I saw her which is expectant based on her steroids causing her ravenous appetite and eating more liquid type foods.     Assessment   1. Hypothyroidism, adult   2. Primary ovarian failure   3. Mixed hyperlipidemia       Tests ordered No orders of the defined types were placed in this encounter.    Plan: 1. She will continue with all medications as before and I will not check any blood work today. 2. I will see her in about 4 months when we will see how she is doing and we will do  blood work then if needed. 3. She will pursue the oral surgery referral by herself now.   Meds ordered this encounter  Medications  . NONFORMULARY OR COMPOUNDED ITEM    Sig: Inject 5 mg into the muscle 2 (two) times a week. Testosterone cypionate in olive  oil (25 mg/mL).  Dispense 5 mL vial.    Dispense:  1 each    Refill:  3    Aziyah Provencal Luther Parody, MD

## 2020-04-05 MED FILL — PROGESTERONE 200 MG CAPS: 200 | 90 days supply | Qty: 180 | Fill #0

## 2020-04-14 ENCOUNTER — Other Ambulatory Visit (INDEPENDENT_AMBULATORY_CARE_PROVIDER_SITE_OTHER): Payer: Self-pay | Admitting: Internal Medicine

## 2020-04-14 MED FILL — ESTRADIOL 2 MG TABS: 2 | 90 days supply | Qty: 90 | Fill #0

## 2020-04-26 ENCOUNTER — Telehealth (INDEPENDENT_AMBULATORY_CARE_PROVIDER_SITE_OTHER): Payer: Self-pay

## 2020-04-26 ENCOUNTER — Other Ambulatory Visit (INDEPENDENT_AMBULATORY_CARE_PROVIDER_SITE_OTHER): Payer: Self-pay | Admitting: Internal Medicine

## 2020-04-26 MED ORDER — ESTRADIOL 2 MG PO TABS: 2.0000 mg | ORAL_TABLET | Freq: Every day | ORAL | 1 refills | Status: DC

## 2020-05-23 ENCOUNTER — Ambulatory Visit (INDEPENDENT_AMBULATORY_CARE_PROVIDER_SITE_OTHER): Payer: No Typology Code available for payment source | Admitting: Dermatology

## 2020-05-23 ENCOUNTER — Other Ambulatory Visit: Payer: Self-pay

## 2020-05-23 ENCOUNTER — Other Ambulatory Visit: Payer: Self-pay | Admitting: Dermatology

## 2020-05-23 DIAGNOSIS — D485 Neoplasm of uncertain behavior of skin: Secondary | ICD-10-CM

## 2020-05-23 DIAGNOSIS — L309 Dermatitis, unspecified: Secondary | ICD-10-CM

## 2020-05-23 DIAGNOSIS — D1801 Hemangioma of skin and subcutaneous tissue: Secondary | ICD-10-CM

## 2020-05-23 DIAGNOSIS — Z1283 Encounter for screening for malignant neoplasm of skin: Secondary | ICD-10-CM | POA: Diagnosis not present

## 2020-05-23 DIAGNOSIS — C4441 Basal cell carcinoma of skin of scalp and neck: Secondary | ICD-10-CM | POA: Diagnosis not present

## 2020-05-23 MED ORDER — CLOBETASOL PROPIONATE 0.05 % EX SOLN: 1.0000 "application " | Freq: Two times a day (BID) | CUTANEOUS | 6 refills | Status: DC

## 2020-05-23 MED FILL — CLOBETASOL PROPIONATE 0.05: 0.05 | 15 days supply | Qty: 25 | Fill #0

## 2020-05-23 NOTE — Patient Instructions (Signed)

## 2020-05-27 ENCOUNTER — Other Ambulatory Visit (HOSPITAL_BASED_OUTPATIENT_CLINIC_OR_DEPARTMENT_OTHER): Payer: Self-pay

## 2020-05-31 ENCOUNTER — Telehealth: Payer: Self-pay | Admitting: *Deleted

## 2020-05-31 ENCOUNTER — Telehealth: Payer: Self-pay | Admitting: Dermatology

## 2020-05-31 NOTE — Telephone Encounter (Signed)
Phone call to patient with her pathology results.  Path to patient.

## 2020-05-31 NOTE — Telephone Encounter (Signed)
-----   Message from Lavonna Monarch, MD sent at 05/31/2020  5:59 AM EDT ----- Schedule surgery with Dr. Darene Lamer

## 2020-05-31 NOTE — Telephone Encounter (Signed)
Left message for patient to return our phone call.

## 2020-05-31 NOTE — Telephone Encounter (Signed)
Patient returned missed call from nurse about biopsy results. That TE had already been singed.

## 2020-06-01 ENCOUNTER — Other Ambulatory Visit (INDEPENDENT_AMBULATORY_CARE_PROVIDER_SITE_OTHER): Payer: Self-pay | Admitting: Internal Medicine

## 2020-06-01 MED FILL — NP THYROID 120 MG TABLET: 120 | 90 days supply | Qty: 180 | Fill #0

## 2020-06-03 ENCOUNTER — Encounter: Payer: Self-pay | Admitting: Dermatology

## 2020-06-03 NOTE — Progress Notes (Signed)
   Follow-Up Visit   Subjective  [name] is a 60 y.o. female who presents for the following: New Patient (Initial Visit) (Patient here today for yearly skin check.  Per patient she has a spot on her right collar bone x 6 months to a year.  ).  Annual skin check, new lesion right mid collarbone Location:  Duration:  Quality:  Associated Signs/Symptoms: Modifying Factors:  Severity:  Timing: Context:   Objective  Well appearing patient in no apparent distress; mood and affect are within normal limits. Objective  Posterior Mid Neck: Patch with chronic to subacute dermatitis compatible with localized eczema.  No vesicles to suggest shingles.  Objective  Left Abdomen (side) - Upper: Multiple red raised smooth 1 to 2 mm papules.   Objective  Neck - Anterior: Waxy pink 7 mm flat lesion, rule out BCC     Objective  Mid Back: General skin examination (areas under undergarments not examined).  No atypical pigmented lesions.  Seborrheic keratosis above left leg crease.    A full examination was performed including scalp, head, eyes, ears, nose, lips, neck, chest, axillae, abdomen, back, buttocks, bilateral upper extremities, bilateral lower extremities, hands, feet, fingers, toes, fingernails, and toenails. All findings within normal limits unless otherwise noted below.  Areas beneath undergarments not fully examined.   Assessment & Plan    Dermatitis Posterior Mid Neck  Generic Temovate daily for 2 weeks; follow-up by MyChart or phone at that time.  Ordered Medications: clobetasol (TEMOVATE) 0.05 % external solution  Cherry angioma Left Abdomen (side) - Upper  Ok to leave if stable.   Neoplasm of uncertain behavior of skin Neck - Anterior  Skin / nail biopsy Type of biopsy: tangential   Informed consent: discussed and consent obtained   Timeout: patient name, date of birth, surgical site, and procedure verified   Procedure prep:  Patient was prepped and  draped in usual sterile fashion (Non sterile) Prep type:  Chlorhexidine Anesthesia: the lesion was anesthetized in a standard fashion   Anesthetic:  1% lidocaine w/ epinephrine 1-100,000 local infiltration Instrument used: flexible razor blade   Outcome: patient tolerated procedure well   Post-procedure details: wound care instructions given    Specimen 1 - Surgical pathology Differential Diagnosis: bcc vs scc  Check Margins: No  Encounter for screening for malignant neoplasm of skin Mid Back  Annual skin examination, encouraged to self examine twice annually.  Continued ultraviolet protection.      I, Lavonna Monarch, MD, have reviewed all documentation for this visit.  The documentation on 06/03/20 for the exam, diagnosis, procedures, and orders are all accurate and complete.

## 2020-07-14 ENCOUNTER — Ambulatory Visit (INDEPENDENT_AMBULATORY_CARE_PROVIDER_SITE_OTHER): Payer: No Typology Code available for payment source | Admitting: Dermatology

## 2020-07-14 ENCOUNTER — Encounter: Payer: Self-pay | Admitting: Dermatology

## 2020-07-14 ENCOUNTER — Other Ambulatory Visit: Payer: Self-pay

## 2020-07-14 DIAGNOSIS — C4441 Basal cell carcinoma of skin of scalp and neck: Secondary | ICD-10-CM

## 2020-07-14 DIAGNOSIS — M67472 Ganglion, left ankle and foot: Secondary | ICD-10-CM | POA: Diagnosis not present

## 2020-07-14 NOTE — Patient Instructions (Signed)

## 2020-07-19 ENCOUNTER — Encounter (INDEPENDENT_AMBULATORY_CARE_PROVIDER_SITE_OTHER): Payer: Self-pay | Admitting: Internal Medicine

## 2020-07-27 ENCOUNTER — Encounter: Payer: Self-pay | Admitting: Dermatology

## 2020-07-27 ENCOUNTER — Encounter (INDEPENDENT_AMBULATORY_CARE_PROVIDER_SITE_OTHER): Payer: Self-pay | Admitting: Internal Medicine

## 2020-07-27 ENCOUNTER — Other Ambulatory Visit: Payer: Self-pay

## 2020-07-27 ENCOUNTER — Ambulatory Visit (INDEPENDENT_AMBULATORY_CARE_PROVIDER_SITE_OTHER): Payer: No Typology Code available for payment source | Admitting: Internal Medicine

## 2020-07-27 VITALS — BP 120/80 | HR 72 | Ht 63.0 in | Wt 167.0 lb

## 2020-07-27 DIAGNOSIS — E039 Hypothyroidism, unspecified: Secondary | ICD-10-CM | POA: Diagnosis not present

## 2020-07-27 DIAGNOSIS — E782 Mixed hyperlipidemia: Secondary | ICD-10-CM | POA: Diagnosis not present

## 2020-07-27 DIAGNOSIS — E2839 Other primary ovarian failure: Secondary | ICD-10-CM

## 2020-07-27 NOTE — Progress Notes (Signed)
   Follow-Up Visit   Subjective  [name] is a 60 y.o. female who presents for the following: Procedure (Here for treatment- neck- anterior - bcc x 1).  BCC on neck.  Also patient wants to check a growth on her foot. Location:  Duration:  Quality:  Associated Signs/Symptoms: Modifying Factors:  Severity:  Timing: Context:   Objective  Well appearing patient in no apparent distress; mood and affect are within normal limits. Objective  Left 3rd Medial Paronychium of Toe: Deep dermal noninflamed nontender 53mm vesicular papule compatible with digital mucous cyst.  Objective  Neck - Anterior: Lesion identified by Dr.Suhail Peloquin and nurse in room.      A focused examination was performed including Head, neck, feet.. Relevant physical exam findings are noted in the Assessment and Plan.   Assessment & Plan    Digital mucous cyst of toe of left foot Left 3rd Medial Paronychium of Toe  I discussed technique for elective removal.  For now patient chooses to leave this benign lesion.  Basal cell carcinoma (BCC) of skin of neck Neck - Anterior  Destruction of lesion Complexity: simple   Destruction method: electrodesiccation and curettage   Informed consent: discussed and consent obtained   Timeout:  patient name, date of birth, surgical site, and procedure verified Anesthesia: the lesion was anesthetized in a standard fashion   Anesthetic:  1% lidocaine w/ epinephrine 1-100,000 local infiltration Curettage performed in three different directions: Yes   Electrodesiccation performed over the curetted area: Yes   Curettage cycles:  3 Lesion length (cm):  1.6 Lesion width (cm):  1.6 Margin per side (cm):  0 Final wound size (cm):  1.6 Hemostasis achieved with:  ferric subsulfate and electrodesiccation Outcome: patient tolerated procedure well with no complications   Post-procedure details: sterile dressing applied and wound care instructions given   Dressing type: bandage  and petrolatum   Additional details:  Wound innoculated with 5 fluorouracil solution.      I, Lavonna Monarch, MD, have reviewed all documentation for this visit.  The documentation on 07/27/20 for the exam, diagnosis, procedures, and orders are all accurate and complete.

## 2020-07-27 NOTE — Progress Notes (Signed)
Metrics: Intervention Frequency ACO  Documented Smoking Status Yearly  Screened one or more times in 24 months  Cessation Counseling or  Active cessation medication Past 24 months  Past 24 months   Guideline developer: UpToDate (See UpToDate for funding source) Date Released: 2014       Wellness Office Visit  Subjective:  Patient ID: [name], female    DOB: 29-Oct-1960  Age: 60 y.o. MRN: [id number]  CC: This delightful lady comes in for follow-up regarding her bioidentical hormone therapy, hypothyroidism.  She also has hyperlipidemia which is not being treated with statin therapy. HPI  She is doing very well but she still is frustrated that she is not able to lose further visceral fat. She is compliant with same doses of estradiol, progesterone, testosterone and NP thyroid. Past Medical History:  Diagnosis Date  . Allergic rhinitis, seasonal   . Hyperlipidemia   . Hypothyroidism, adult 12/15/2018  . 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 Thyroid disease   . Vitamin D deficiency disease 12/15/2018   Past Surgical History:  Procedure Laterality Date  . ABDOMINAL HYSTERECTOMY  2000  . CESAREAN SECTION     x's 2 93 & 95  . TMJ ARTHROPLASTY  1988     Family History  Problem Relation Age of Onset  . Melanoma Mother 19  . Hypertension Father   . Hyperlipidemia Father   . Dementia Maternal Grandmother 90  . 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   Social History   Tobacco Use  . Smoking status: Never Smoker  . Smokeless tobacco: Never Used  Substance Use Topics  . Alcohol use: Yes    Alcohol/week: 2.0 standard drinks    Types: 2 Standard drinks or equivalent per week    Comment: occ    Current Meds  Medication Sig  . estradiol (ESTRACE) 2 MG tablet TAKE 1 TABLET BY MOUTH ONCE A DAY  .  Melatonin 10 MG TABS Take 1 tablet by mouth daily at 10 pm.   . NONFORMULARY OR COMPOUNDED ITEM Inject 5 mg into the muscle 2 (two) times a week. Testosterone cypionate in olive  oil (25 mg/mL).  Dispense 5 mL vial.  . NP THYROID 120 MG tablet TAKE 1 TABLET BY MOUTH 2 TIMES DAILY  . Omega-3 Fatty Acids (FISH OIL) 1000 MG CPDR Take by mouth daily.  . progesterone (PROMETRIUM) 200 MG capsule TAKE 2 CAPSULES BY MOUTH EVERY EVENING  . [DISCONTINUED] NONFORMULARY OR COMPOUNDED ITEM Inject 5 mg into the muscle 2 (two) times a week. Testosterone cypionate in olive  oil (25 mg/mL).  Dispense 5 mL vial.       Objective:   Today's Vitals: BP 120/80   Pulse 72   Ht 5\' 3"  (1.6 m)   Wt 167 lb (75.8 kg)   BMI 29.58 kg/m  Vitals with BMI 07/27/2020 03/28/2020 03/14/2020  Height 5\' 3"  5\' 3"  5\' 3"   Weight 167 lbs 165 lbs 6 oz 160 lbs 6 oz  BMI 29.59 95.09 32.67  Systolic 124 580 998  Diastolic 80 80 74  Pulse 72 86 70     Physical Exam  She looks systemically well.  Weight is stable.  Blood pressure is excellent.     Assessment   1. Hypothyroidism, adult   2. Primary  ovarian failure   3. Mixed hyperlipidemia       Tests ordered No orders of the defined types were placed in this encounter.    Plan: 1. Continue with same dose of NP thyroid 100 mg twice a day. 2. Continue with estradiol 2 mg daily, progesterone 400 mg at night and testosterone 5 mg intramuscular twice a week. 3. Today we also discussed insulin resistance and the use of GLP-1 agonist such as Rybelsus.  She will think about it. 4. She will focus on consistent nutrition including intermittent fasting as well as exercise with a combination of high intensity interval training and strength training. 5. I will see her in November for an annual physical exam when we will do blood work..   No orders of the defined types were placed in this encounter.   Doree Albee, MD

## 2020-08-09 ENCOUNTER — Other Ambulatory Visit (HOSPITAL_COMMUNITY): Payer: Self-pay

## 2020-08-09 ENCOUNTER — Other Ambulatory Visit (INDEPENDENT_AMBULATORY_CARE_PROVIDER_SITE_OTHER): Payer: Self-pay | Admitting: Internal Medicine

## 2020-08-09 MED ORDER — PROGESTERONE 200 MG PO CAPS
400.0000 mg | ORAL_CAPSULE | Freq: Every evening | ORAL | 1 refills | Status: DC
  Filled 2020-08-09: qty 180, 90d supply, fill #0
  Filled 2020-10-26 – 2020-11-09 (×2): qty 180, 90d supply, fill #1

## 2020-08-09 MED FILL — Estradiol Tab 2 MG: ORAL | 90 days supply | Qty: 90 | Fill #0 | Status: AC

## 2020-08-12 ENCOUNTER — Other Ambulatory Visit (HOSPITAL_COMMUNITY): Payer: Self-pay

## 2020-08-30 ENCOUNTER — Other Ambulatory Visit (INDEPENDENT_AMBULATORY_CARE_PROVIDER_SITE_OTHER): Payer: Self-pay

## 2020-08-31 ENCOUNTER — Encounter (INDEPENDENT_AMBULATORY_CARE_PROVIDER_SITE_OTHER): Payer: Self-pay | Admitting: Internal Medicine

## 2020-08-31 ENCOUNTER — Other Ambulatory Visit (HOSPITAL_COMMUNITY): Payer: Self-pay

## 2020-08-31 ENCOUNTER — Other Ambulatory Visit (INDEPENDENT_AMBULATORY_CARE_PROVIDER_SITE_OTHER): Payer: Self-pay | Admitting: Internal Medicine

## 2020-08-31 MED ORDER — TESTOSTERONE CYPIONATE 100 MG/ML IM SOLN
INTRAMUSCULAR | 2 refills | Status: DC
  Filled 2020-08-31: qty 10, 28d supply, fill #0

## 2020-08-31 NOTE — Telephone Encounter (Signed)
PLEASE ADVISE.

## 2020-09-09 ENCOUNTER — Other Ambulatory Visit (HOSPITAL_COMMUNITY): Payer: Self-pay

## 2020-09-12 ENCOUNTER — Other Ambulatory Visit (INDEPENDENT_AMBULATORY_CARE_PROVIDER_SITE_OTHER): Payer: Self-pay

## 2020-09-19 ENCOUNTER — Other Ambulatory Visit (HOSPITAL_COMMUNITY): Payer: Self-pay

## 2020-09-20 ENCOUNTER — Telehealth (INDEPENDENT_AMBULATORY_CARE_PROVIDER_SITE_OTHER): Payer: Self-pay | Admitting: Internal Medicine

## 2020-09-20 ENCOUNTER — Other Ambulatory Visit (HOSPITAL_COMMUNITY): Payer: Self-pay

## 2020-09-20 NOTE — Telephone Encounter (Signed)
Please let [facility] know that the patient is going to get testosterone from elsewhere.  Thanks.

## 2020-09-20 NOTE — Telephone Encounter (Signed)
Pharmacy called checking on status of refill for testosterone cypionate (DEPOTESTOTERONE CYPIONATE) 100, prior authorization has been faxed to office. Please advise

## 2020-09-26 ENCOUNTER — Telehealth (INDEPENDENT_AMBULATORY_CARE_PROVIDER_SITE_OTHER): Payer: Self-pay

## 2020-09-28 MED FILL — Thyroid Tab 120 MG (2 Grain): ORAL | 90 days supply | Qty: 180 | Fill #0 | Status: AC

## 2020-09-29 ENCOUNTER — Other Ambulatory Visit (HOSPITAL_COMMUNITY): Payer: Self-pay

## 2020-10-26 ENCOUNTER — Other Ambulatory Visit (HOSPITAL_COMMUNITY): Payer: Self-pay

## 2020-10-26 MED FILL — Estradiol Tab 2 MG: ORAL | 90 days supply | Qty: 90 | Fill #1 | Status: CN

## 2020-10-31 ENCOUNTER — Other Ambulatory Visit (HOSPITAL_COMMUNITY): Payer: Self-pay

## 2020-10-31 MED ORDER — ESTRADIOL 2 MG PO TABS
ORAL_TABLET | ORAL | 3 refills | Status: DC
  Filled 2020-10-31 – 2020-11-09 (×2): qty 90, 90d supply, fill #0

## 2020-10-31 MED ORDER — PROGESTERONE MICRONIZED 100 MG PO CAPS
ORAL_CAPSULE | ORAL | 3 refills | Status: DC
  Filled 2020-10-31: qty 90, 90d supply, fill #0

## 2020-11-03 ENCOUNTER — Other Ambulatory Visit (HOSPITAL_COMMUNITY): Payer: Self-pay

## 2020-11-09 ENCOUNTER — Other Ambulatory Visit (HOSPITAL_COMMUNITY): Payer: Self-pay

## 2020-11-15 ENCOUNTER — Ambulatory Visit: Payer: No Typology Code available for payment source | Admitting: Dermatology

## 2020-11-16 ENCOUNTER — Other Ambulatory Visit (HOSPITAL_COMMUNITY): Payer: Self-pay

## 2021-01-05 ENCOUNTER — Encounter (INDEPENDENT_AMBULATORY_CARE_PROVIDER_SITE_OTHER): Payer: No Typology Code available for payment source | Admitting: Internal Medicine

## 2021-02-01 ENCOUNTER — Other Ambulatory Visit (HOSPITAL_COMMUNITY): Payer: Self-pay

## 2021-02-01 ENCOUNTER — Other Ambulatory Visit: Payer: Self-pay

## 2021-02-01 ENCOUNTER — Encounter: Payer: Self-pay | Admitting: Nurse Practitioner

## 2021-02-01 ENCOUNTER — Ambulatory Visit (INDEPENDENT_AMBULATORY_CARE_PROVIDER_SITE_OTHER): Payer: No Typology Code available for payment source | Admitting: Nurse Practitioner

## 2021-02-01 VITALS — BP 136/88 | HR 86 | Temp 99.0°F | Ht 63.4 in | Wt 173.0 lb

## 2021-02-01 DIAGNOSIS — E039 Hypothyroidism, unspecified: Secondary | ICD-10-CM | POA: Diagnosis not present

## 2021-02-01 DIAGNOSIS — E2839 Other primary ovarian failure: Secondary | ICD-10-CM

## 2021-02-01 DIAGNOSIS — Z1211 Encounter for screening for malignant neoplasm of colon: Secondary | ICD-10-CM

## 2021-02-01 MED ORDER — "TUBERCULIN SYRINGE 27G X 1/2"" 1 ML MISC"
1.0000 | 1 refills | Status: AC
  Filled 2021-02-01 – 2021-02-02 (×2): qty 8, 56d supply, fill #0

## 2021-02-01 MED ORDER — ESTRADIOL 2 MG PO TABS
ORAL_TABLET | ORAL | 1 refills | Status: AC
  Filled 2021-02-01: qty 90, 90d supply, fill #0
  Filled 2021-06-07: qty 90, 90d supply, fill #1

## 2021-02-01 MED ORDER — NP THYROID 120 MG PO TABS
120.0000 mg | ORAL_TABLET | Freq: Two times a day (BID) | ORAL | 1 refills | Status: AC
  Filled 2021-02-01: qty 180, 90d supply, fill #0
  Filled 2021-06-07: qty 180, 90d supply, fill #1

## 2021-02-01 MED ORDER — PROGESTERONE 200 MG PO CAPS
400.0000 mg | ORAL_CAPSULE | Freq: Every evening | ORAL | 1 refills | Status: AC
  Filled 2021-02-01: qty 180, 90d supply, fill #0
  Filled 2021-06-07: qty 180, 90d supply, fill #1

## 2021-02-01 MED ORDER — TESTOSTERONE CYPIONATE 100 MG/ML IM SOLN
INTRAMUSCULAR | 1 refills | Status: DC
  Filled 2021-02-01: qty 10, 28d supply, fill #0

## 2021-02-01 NOTE — Patient Instructions (Signed)
[place] for [employer], [place]

## 2021-02-01 NOTE — Progress Notes (Signed)
I,Katawbba Wiggins,acting as a Education administrator for [employer], FNP.,have documented all relevant documentation on the behalf of Minette Brine, FNP,as directed by  Minette Brine, FNP while in the presence of Minette Brine, [place].  This visit occurred during the SARS-CoV-2 public health emergency.  Safety protocols were in place, including screening questions prior to the visit, additional usage of staff PPE, and extensive cleaning of exam room while observing appropriate contact time as indicated for disinfecting solutions.  Subjective:     Patient ID: [name] , female    DOB: 10-09-1960 , 60 y.o.   MRN: [id number]   No chief complaint on file.   HPI  The patient is here to establish care and for hormone therapy. She had been seeing Dr. Anastasio Champion. She found [place] on MyChart, had been looking for a primary care provider since her provider passed away.  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 Divorced. 2 children - son (healthy) daughter (autoimmune disease, mostly skin issues, asthma).    She received a refill of her hormones - she had been treated with bioidentical hormones. She has been on hormone therapy had been titrating up. Has been on 2 mg estrogen, progesterone 2 years and thyroid has been adjusted up.  She had been having symptoms of being cold, eye brows almost gone, fatigue, hair loss.  Also was taking to manage her cholesterol.   She is concerned about her increase in weight about 20 lbs in the last year. She exercises 3 times a week and does a Paleo diet. She had been exercising 4 times a week several years ago. She drinks about 6 - 8 oz glasses of water a day and coffee. She has never taken any medications for weight loss other than when she initially started the hormone therapy.       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 , Disp: , Rfl:    testosterone cypionate (DEPOTESTOSTERONE CYPIONATE) 200 MG/ML injection, Inject 0.5 ml to muscle weekly discard after 28 days of initiation., Disp: 10 mL, Rfl: 0   TUBERCULIN SYR 1CC/27GX1/2" (B-D TB SYRINGE 1CC/27GX1/2") 27G X 1/2" 1 ML MISC, Use as directed once a week, Disp: 100 each, Rfl: 1   estradiol (ESTRACE) 2 MG tablet, Take 1 tablet by mouth daily, Disp: 90 tablet, Rfl: 1   NP THYROID 120 MG tablet, TAKE 1 TABLET BY MOUTH 2 TIMES DAILY, Disp: 180 tablet, Rfl: 1   progesterone (PROMETRIUM) 200 MG capsule, Take 2 capsules (400 mg total) by mouth at bedtime., Disp: 180 capsule, Rfl: 1   No Known Allergies   Review of Systems  Constitutional: Negative.   HENT: Negative.    Eyes: Negative.   Respiratory: Negative.    Cardiovascular: Negative.   Gastrointestinal: Negative.   Endocrine: Negative.   Genitourinary: Negative.   Musculoskeletal: Negative.   Skin: Negative.   Allergic/Immunologic: Negative.  Neurological: Negative.   Hematological: Negative.   Psychiatric/Behavioral: Negative.      Today's Vitals   02/01/21 1439  BP: 136/88  Pulse: 86  Temp: 99 F (37.2 C)  Weight: 173 lb (78.5 kg)  Height: 5' 3.4" (1.61 m)   Body mass index is 30.26 kg/m.   Objective:  Physical Exam Vitals reviewed.  Constitutional:      General: She is not in acute distress.    Appearance: Normal appearance. She is well-developed. She is obese.  HENT:     Head: Normocephalic and atraumatic.  Eyes:     Pupils: Pupils are equal, round, and reactive to light.  Cardiovascular:     Rate and Rhythm: Normal rate and regular  rhythm.     Pulses: Normal pulses.     Heart sounds: Normal heart sounds. No murmur heard. Pulmonary:     Effort: Pulmonary effort is normal. No respiratory distress.     Breath sounds: Normal breath sounds. No wheezing.  Musculoskeletal:        General: No swelling or tenderness. Normal range of motion.  Skin:    General: Skin is warm and dry.     Capillary Refill: Capillary refill takes less than 2 seconds.  Neurological:     General: No focal deficit present.     Mental Status: She is alert and oriented to person, place, and time.     Cranial Nerves: No cranial nerve deficit.     Motor: No weakness.  Psychiatric:        Mood and Affect: Mood normal.        Behavior: Behavior normal.        Thought Content: Thought content normal.        Judgment: Judgment normal.        Assessment And Plan:     1. Primary ovarian failure Comments: I will be referring her to Dr. Ace Gins for hormone therapy. I did refill her medications this time until she can get in with Dr Ace Gins.  - TUBERCULIN SYR 1CC/27GX1/2" (B-D TB SYRINGE 1CC/27GX1/2") 27G X 1/2" 1 ML MISC; Use as directed once a week  Dispense: 100 each; Refill: 1 - Ambulatory referral to Internal Medicine  2. Hypothyroidism, adult Comments: No labs this visit she will get with the hormone therapy specialist. She prefers bioidentical hormone therapy - NP THYROID 120 MG tablet; TAKE 1 TABLET BY MOUTH 2 TIMES DAILY  Dispense: 180 tablet; Refill: 1 - Ambulatory referral to Internal Medicine  3. Encounter for screening colonoscopy According to USPTF Colorectal cancer Screening guidelines. Colonoscopy is recommended every 10 years, starting at age 71years. Will refer to GI for colon cancer screening. - Ambulatory referral to Gastroenterology    Patient was given opportunity to ask questions. Patient verbalized understanding of the plan and was able to repeat key elements of the plan. All questions were answered to their satisfaction.   Minette Brine, FNP   I, Minette Brine, FNP, have reviewed all documentation for this visit. The documentation on 02/01/21 for the exam, diagnosis, procedures, and orders are all accurate and complete.   IF YOU HAVE BEEN REFERRED TO A SPECIALIST, IT MAY TAKE 1-2 WEEKS TO SCHEDULE/PROCESS THE REFERRAL. IF YOU HAVE NOT HEARD FROM US/SPECIALIST IN TWO WEEKS, PLEASE GIVE [place] A CALL AT [phone].   THE PATIENT IS ENCOURAGED TO PRACTICE SOCIAL DISTANCING DUE TO THE COVID-19 PANDEMIC.

## 2021-02-02 ENCOUNTER — Other Ambulatory Visit (HOSPITAL_COMMUNITY): Payer: Self-pay

## 2021-02-06 MED ORDER — TESTOSTERONE CYPIONATE 200 MG/ML IM SOLN: INTRAMUSCULAR | 0 refills | Status: AC

## 2021-02-09 ENCOUNTER — Other Ambulatory Visit (HOSPITAL_COMMUNITY): Payer: Self-pay

## 2021-03-15 DIAGNOSIS — G4739 Other sleep apnea: Secondary | ICD-10-CM | POA: Diagnosis not present

## 2021-03-15 DIAGNOSIS — E781 Pure hyperglyceridemia: Secondary | ICD-10-CM | POA: Diagnosis not present

## 2021-03-15 DIAGNOSIS — M6250 Muscle wasting and atrophy, not elsewhere classified, unspecified site: Secondary | ICD-10-CM | POA: Diagnosis not present

## 2021-03-15 DIAGNOSIS — N951 Menopausal and female climacteric states: Secondary | ICD-10-CM | POA: Diagnosis not present

## 2021-03-15 DIAGNOSIS — E669 Obesity, unspecified: Secondary | ICD-10-CM | POA: Diagnosis not present

## 2021-03-15 DIAGNOSIS — E8881 Metabolic syndrome: Secondary | ICD-10-CM | POA: Diagnosis not present

## 2021-03-23 IMAGING — CT CT CARDIAC CORONARY ARTERY CALCIUM SCORE
2 series · 16 of 20 positions shown, 18 images · non-contrast
Comparison: None.

Addendum:
CLINICAL DATA: Risk stratification

EXAM:
Coronary Calcium Score
TECHNIQUE: The patient was scanned on a Siemens Force scanner. Axial
non-contrast 3 mm slices were carried out through the heart. The
data set was analyzed on a dedicated work station and scored using
the Agatson method.
MEDICATIONS:
None

[Series 2: 2 hrt calcium 68 % · axial · 0.30mm/px · z∈[-57,+54]mm · 8 of 49 slices shown, 10 images]
[im 6/49  vessel]
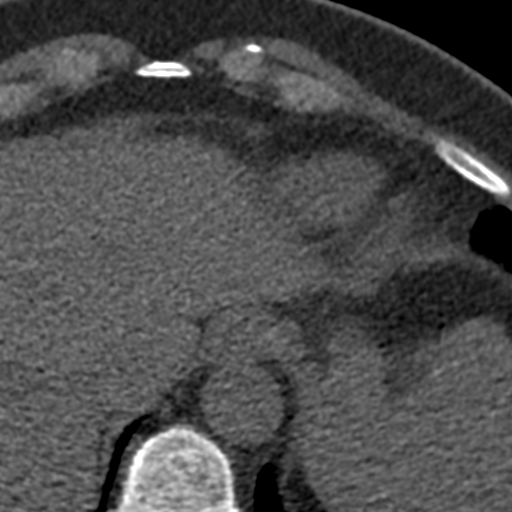
[im 6/49  lung]
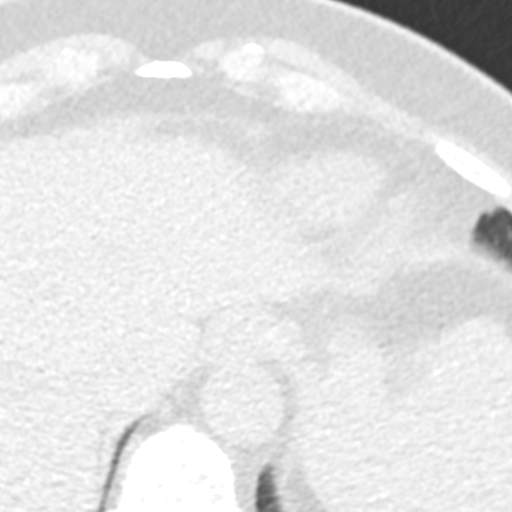
[im 11/49  vessel]
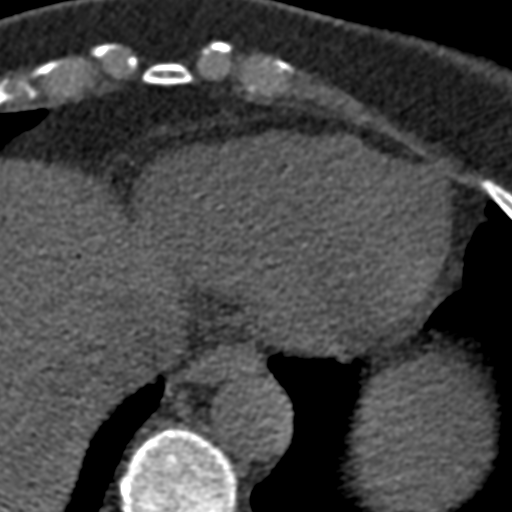
[im 17/49  vessel]
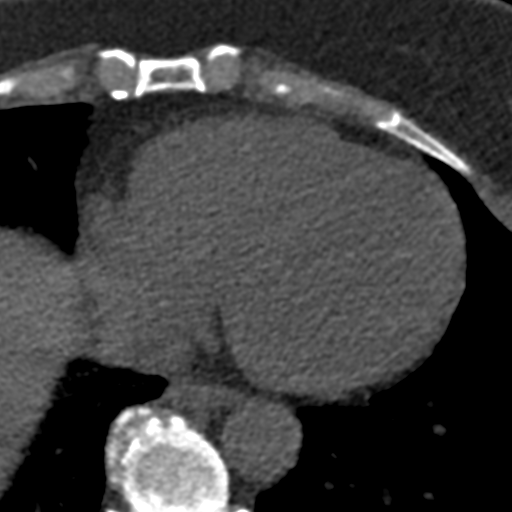
[im 22/49  vessel]
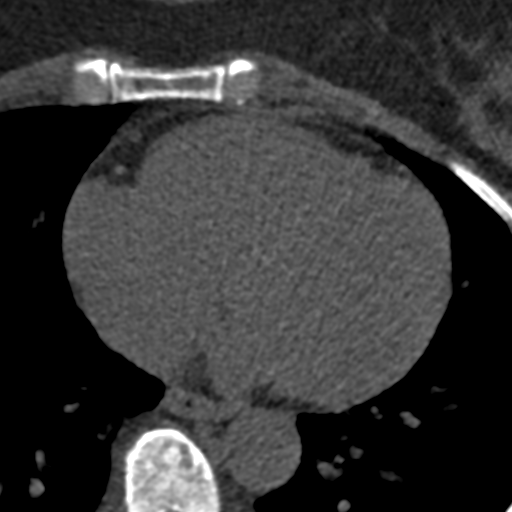
[im 27/49  vessel]
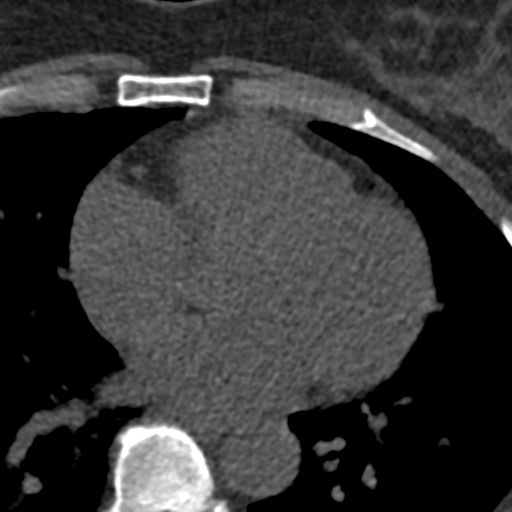
[im 27/49  lung]
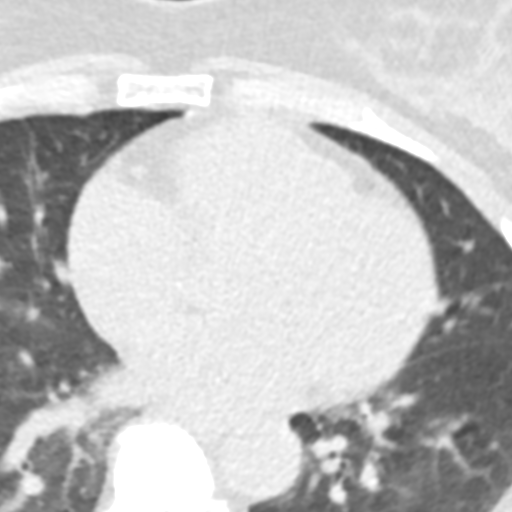
[im 33/49  vessel]
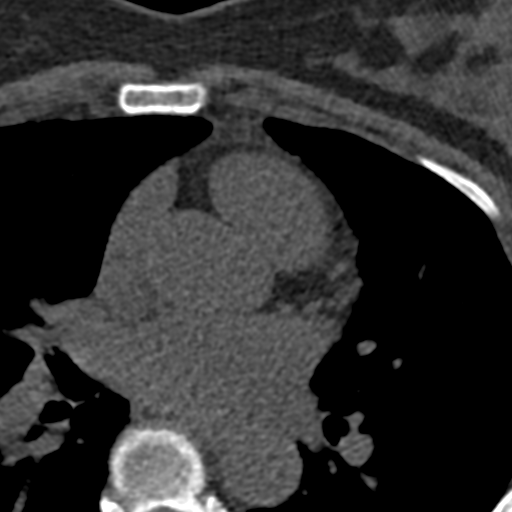
[im 38/49  vessel]
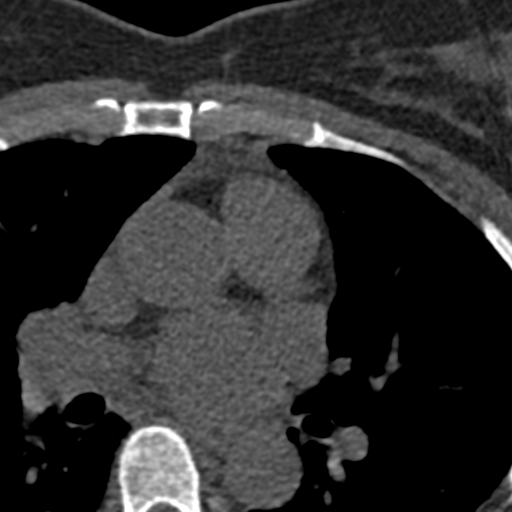
[im 43/49  vessel]
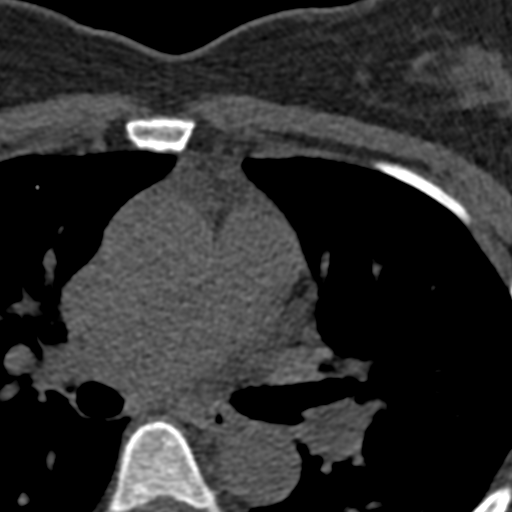

[Series 3: 2 soft full fov 68 % · axial · 0.74mm/px · z∈[-56,+58]mm · 8 of 50 slices shown]
[im 6/50  vessel]
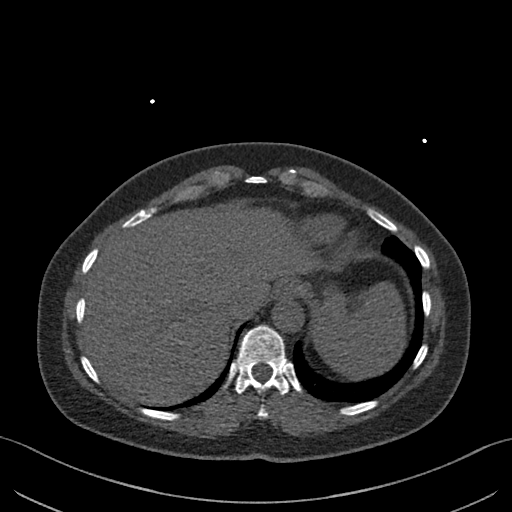
[im 11/50  vessel]
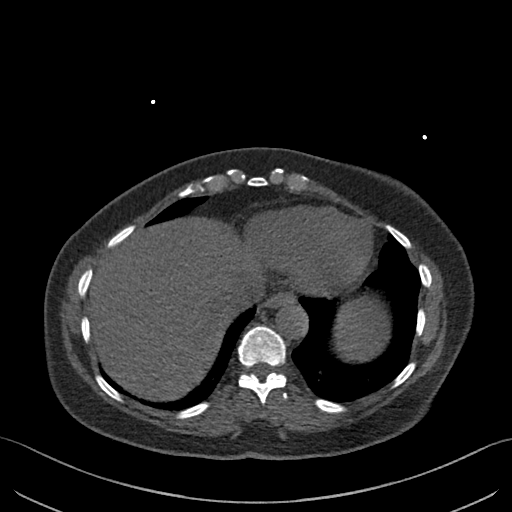
[im 17/50  vessel]
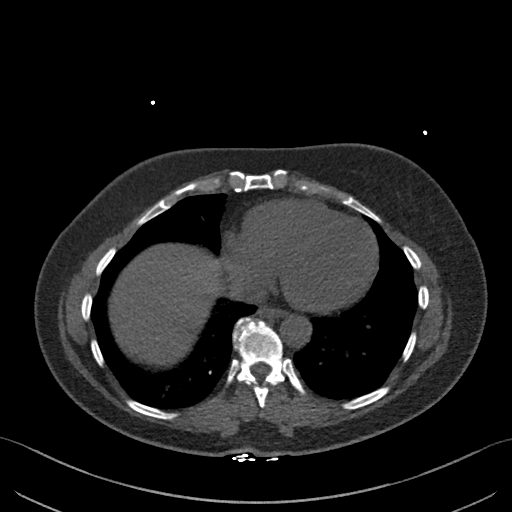
[im 22/50  vessel]
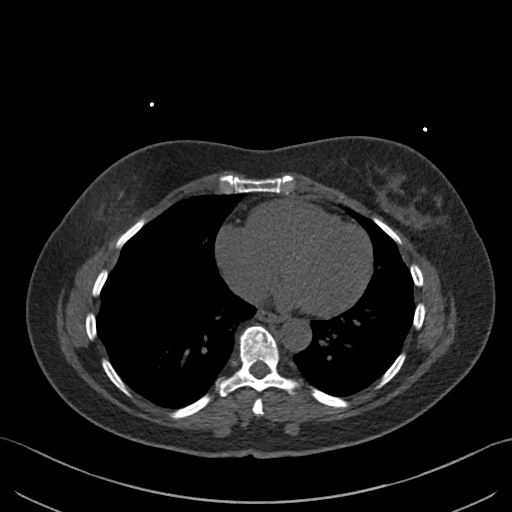
[im 28/50  vessel]
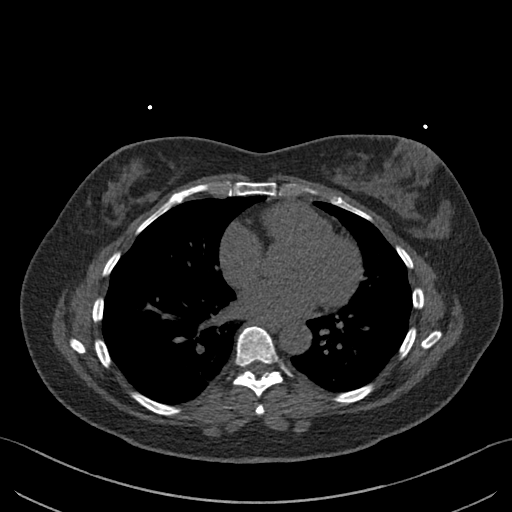
[im 33/50  vessel]
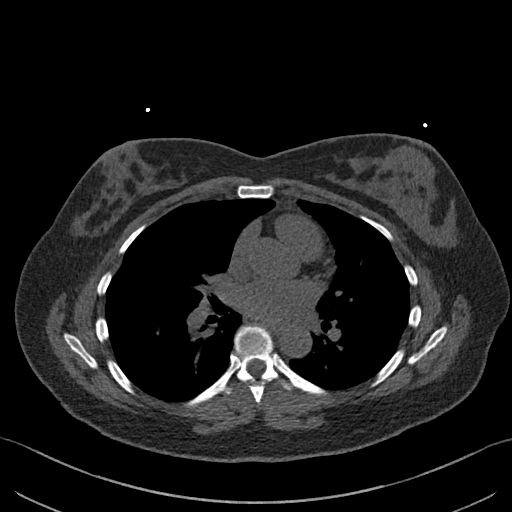
[im 39/50  vessel]
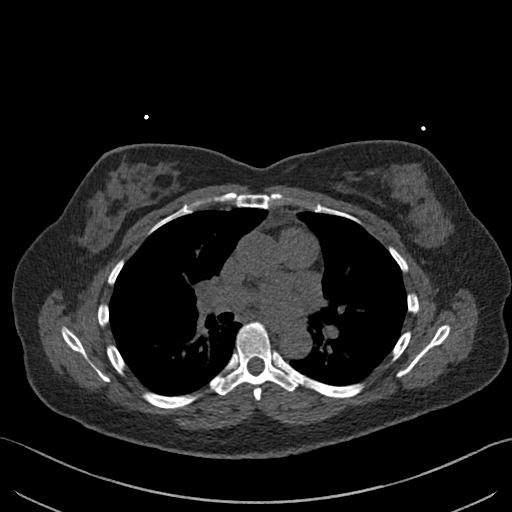
[im 44/50  vessel]
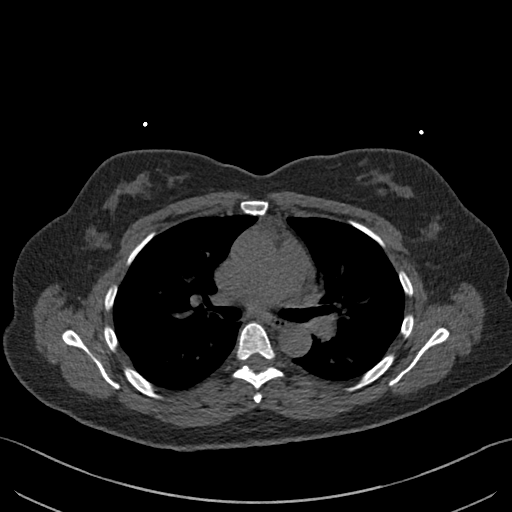

[16 of 20 positions shown; findings below may reference images not displayed]

FINDINGS: Non-cardiac: See separate report from [REDACTED].

Ascending Aorta: Normal size, trivial calcifications.

Pericardium: Normal.

Coronary arteries: Normal origin.
IMPRESSION: Coronary calcium score of 0. This was 0 percentile for age and sex
matched control.

EXAM:
OVER-READ INTERPRETATION  CT CHEST

The following report is an over-read performed by radiologist Dr.
over-read does not include interpretation of cardiac or coronary
anatomy or pathology. The coronary calcium interpretation by the
cardiologist is attached.
FINDINGS: Limited view of the lung parenchyma demonstrates no suspicious
nodularity. Airways are normal.

Limited view of the mediastinum demonstrates no adenopathy.
Esophagus normal.

Limited view of the upper abdomen unremarkable.

Limited view of the skeleton and chest wall is unremarkable.
IMPRESSION: No significant extracardiac findings.

*** End of Addendum ***
FINDINGS: Non-cardiac: See separate report from [REDACTED].

Ascending Aorta: Normal size, trivial calcifications.

Pericardium: Normal.

Coronary arteries: Normal origin.
IMPRESSION: Coronary calcium score of 0. This was 0 percentile for age and sex
matched control.

## 2021-04-19 ENCOUNTER — Ambulatory Visit: Payer: 59 | Admitting: Nurse Practitioner

## 2021-04-19 DIAGNOSIS — G4739 Other sleep apnea: Secondary | ICD-10-CM | POA: Diagnosis not present

## 2021-04-19 DIAGNOSIS — E669 Obesity, unspecified: Secondary | ICD-10-CM | POA: Diagnosis not present

## 2021-04-19 DIAGNOSIS — M6250 Muscle wasting and atrophy, not elsewhere classified, unspecified site: Secondary | ICD-10-CM | POA: Diagnosis not present

## 2021-04-19 DIAGNOSIS — E8881 Metabolic syndrome: Secondary | ICD-10-CM | POA: Diagnosis not present

## 2021-04-19 DIAGNOSIS — E781 Pure hyperglyceridemia: Secondary | ICD-10-CM | POA: Diagnosis not present

## 2021-04-19 DIAGNOSIS — N951 Menopausal and female climacteric states: Secondary | ICD-10-CM | POA: Diagnosis not present

## 2021-04-21 DIAGNOSIS — E669 Obesity, unspecified: Secondary | ICD-10-CM | POA: Diagnosis not present

## 2021-04-21 DIAGNOSIS — N951 Menopausal and female climacteric states: Secondary | ICD-10-CM | POA: Diagnosis not present

## 2021-04-21 DIAGNOSIS — E8881 Metabolic syndrome: Secondary | ICD-10-CM | POA: Diagnosis not present

## 2021-04-21 DIAGNOSIS — E781 Pure hyperglyceridemia: Secondary | ICD-10-CM | POA: Diagnosis not present

## 2021-04-21 DIAGNOSIS — G4739 Other sleep apnea: Secondary | ICD-10-CM | POA: Diagnosis not present

## 2021-04-21 DIAGNOSIS — M6250 Muscle wasting and atrophy, not elsewhere classified, unspecified site: Secondary | ICD-10-CM | POA: Diagnosis not present

## 2021-06-07 ENCOUNTER — Other Ambulatory Visit (HOSPITAL_COMMUNITY): Payer: Self-pay

## 2021-06-19 DIAGNOSIS — M6250 Muscle wasting and atrophy, not elsewhere classified, unspecified site: Secondary | ICD-10-CM | POA: Diagnosis not present

## 2021-06-19 DIAGNOSIS — G4739 Other sleep apnea: Secondary | ICD-10-CM | POA: Diagnosis not present

## 2021-06-19 DIAGNOSIS — E669 Obesity, unspecified: Secondary | ICD-10-CM | POA: Diagnosis not present

## 2021-06-19 DIAGNOSIS — E781 Pure hyperglyceridemia: Secondary | ICD-10-CM | POA: Diagnosis not present

## 2021-06-19 DIAGNOSIS — N951 Menopausal and female climacteric states: Secondary | ICD-10-CM | POA: Diagnosis not present

## 2021-06-19 DIAGNOSIS — E8881 Metabolic syndrome: Secondary | ICD-10-CM | POA: Diagnosis not present

## 2021-06-20 DIAGNOSIS — M6250 Muscle wasting and atrophy, not elsewhere classified, unspecified site: Secondary | ICD-10-CM | POA: Diagnosis not present

## 2021-06-20 DIAGNOSIS — E8881 Metabolic syndrome: Secondary | ICD-10-CM | POA: Diagnosis not present

## 2021-06-20 DIAGNOSIS — E669 Obesity, unspecified: Secondary | ICD-10-CM | POA: Diagnosis not present

## 2021-06-20 DIAGNOSIS — G4739 Other sleep apnea: Secondary | ICD-10-CM | POA: Diagnosis not present

## 2021-06-20 DIAGNOSIS — N951 Menopausal and female climacteric states: Secondary | ICD-10-CM | POA: Diagnosis not present

## 2021-06-20 DIAGNOSIS — E781 Pure hyperglyceridemia: Secondary | ICD-10-CM | POA: Diagnosis not present

## 2021-07-05 DIAGNOSIS — E669 Obesity, unspecified: Secondary | ICD-10-CM | POA: Diagnosis not present

## 2021-07-05 DIAGNOSIS — M6250 Muscle wasting and atrophy, not elsewhere classified, unspecified site: Secondary | ICD-10-CM | POA: Diagnosis not present

## 2021-07-05 DIAGNOSIS — G4739 Other sleep apnea: Secondary | ICD-10-CM | POA: Diagnosis not present

## 2021-07-05 DIAGNOSIS — E8881 Metabolic syndrome: Secondary | ICD-10-CM | POA: Diagnosis not present

## 2021-07-05 DIAGNOSIS — N951 Menopausal and female climacteric states: Secondary | ICD-10-CM | POA: Diagnosis not present

## 2021-07-05 DIAGNOSIS — E781 Pure hyperglyceridemia: Secondary | ICD-10-CM | POA: Diagnosis not present

## 2021-07-21 DIAGNOSIS — D120 Benign neoplasm of cecum: Secondary | ICD-10-CM | POA: Diagnosis not present

## 2021-07-21 DIAGNOSIS — K649 Unspecified hemorrhoids: Secondary | ICD-10-CM | POA: Diagnosis not present

## 2021-07-21 DIAGNOSIS — Z86010 Personal history of colonic polyps: Secondary | ICD-10-CM | POA: Diagnosis not present

## 2021-07-21 DIAGNOSIS — Q438 Other specified congenital malformations of intestine: Secondary | ICD-10-CM | POA: Diagnosis not present

## 2021-07-21 DIAGNOSIS — Z9889 Other specified postprocedural states: Secondary | ICD-10-CM | POA: Diagnosis not present

## 2021-08-03 DIAGNOSIS — H5203 Hypermetropia, bilateral: Secondary | ICD-10-CM | POA: Diagnosis not present

## 2021-08-09 DIAGNOSIS — M6250 Muscle wasting and atrophy, not elsewhere classified, unspecified site: Secondary | ICD-10-CM | POA: Diagnosis not present

## 2021-08-09 DIAGNOSIS — E781 Pure hyperglyceridemia: Secondary | ICD-10-CM | POA: Diagnosis not present

## 2021-08-09 DIAGNOSIS — E8881 Metabolic syndrome: Secondary | ICD-10-CM | POA: Diagnosis not present

## 2021-08-09 DIAGNOSIS — E669 Obesity, unspecified: Secondary | ICD-10-CM | POA: Diagnosis not present

## 2021-08-09 DIAGNOSIS — N951 Menopausal and female climacteric states: Secondary | ICD-10-CM | POA: Diagnosis not present

## 2021-08-09 DIAGNOSIS — G4739 Other sleep apnea: Secondary | ICD-10-CM | POA: Diagnosis not present

## 2021-08-14 DIAGNOSIS — N951 Menopausal and female climacteric states: Secondary | ICD-10-CM | POA: Diagnosis not present

## 2021-08-14 DIAGNOSIS — G4739 Other sleep apnea: Secondary | ICD-10-CM | POA: Diagnosis not present

## 2021-08-14 DIAGNOSIS — M6250 Muscle wasting and atrophy, not elsewhere classified, unspecified site: Secondary | ICD-10-CM | POA: Diagnosis not present

## 2021-08-14 DIAGNOSIS — E781 Pure hyperglyceridemia: Secondary | ICD-10-CM | POA: Diagnosis not present

## 2021-08-14 DIAGNOSIS — E8881 Metabolic syndrome: Secondary | ICD-10-CM | POA: Diagnosis not present

## 2021-08-14 DIAGNOSIS — E669 Obesity, unspecified: Secondary | ICD-10-CM | POA: Diagnosis not present

## 2021-09-01 DIAGNOSIS — E781 Pure hyperglyceridemia: Secondary | ICD-10-CM | POA: Diagnosis not present

## 2021-09-01 DIAGNOSIS — M6250 Muscle wasting and atrophy, not elsewhere classified, unspecified site: Secondary | ICD-10-CM | POA: Diagnosis not present

## 2021-09-01 DIAGNOSIS — E669 Obesity, unspecified: Secondary | ICD-10-CM | POA: Diagnosis not present

## 2021-09-01 DIAGNOSIS — N951 Menopausal and female climacteric states: Secondary | ICD-10-CM | POA: Diagnosis not present

## 2021-09-01 DIAGNOSIS — G4739 Other sleep apnea: Secondary | ICD-10-CM | POA: Diagnosis not present

## 2021-09-01 DIAGNOSIS — E8881 Metabolic syndrome: Secondary | ICD-10-CM | POA: Diagnosis not present

## 2021-09-04 DIAGNOSIS — M6250 Muscle wasting and atrophy, not elsewhere classified, unspecified site: Secondary | ICD-10-CM | POA: Diagnosis not present

## 2021-09-04 DIAGNOSIS — E781 Pure hyperglyceridemia: Secondary | ICD-10-CM | POA: Diagnosis not present

## 2021-09-04 DIAGNOSIS — G4739 Other sleep apnea: Secondary | ICD-10-CM | POA: Diagnosis not present

## 2021-09-04 DIAGNOSIS — E669 Obesity, unspecified: Secondary | ICD-10-CM | POA: Diagnosis not present

## 2021-09-04 DIAGNOSIS — E8881 Metabolic syndrome: Secondary | ICD-10-CM | POA: Diagnosis not present

## 2021-09-04 DIAGNOSIS — N951 Menopausal and female climacteric states: Secondary | ICD-10-CM | POA: Diagnosis not present

## 2021-09-27 DIAGNOSIS — E8881 Metabolic syndrome: Secondary | ICD-10-CM | POA: Diagnosis not present

## 2021-09-27 DIAGNOSIS — E669 Obesity, unspecified: Secondary | ICD-10-CM | POA: Diagnosis not present

## 2021-09-27 DIAGNOSIS — N951 Menopausal and female climacteric states: Secondary | ICD-10-CM | POA: Diagnosis not present

## 2021-09-27 DIAGNOSIS — E781 Pure hyperglyceridemia: Secondary | ICD-10-CM | POA: Diagnosis not present

## 2021-09-27 DIAGNOSIS — G4739 Other sleep apnea: Secondary | ICD-10-CM | POA: Diagnosis not present

## 2021-09-27 DIAGNOSIS — M6250 Muscle wasting and atrophy, not elsewhere classified, unspecified site: Secondary | ICD-10-CM | POA: Diagnosis not present

## 2021-10-13 DIAGNOSIS — M6250 Muscle wasting and atrophy, not elsewhere classified, unspecified site: Secondary | ICD-10-CM | POA: Diagnosis not present

## 2021-10-13 DIAGNOSIS — E781 Pure hyperglyceridemia: Secondary | ICD-10-CM | POA: Diagnosis not present

## 2021-10-13 DIAGNOSIS — E669 Obesity, unspecified: Secondary | ICD-10-CM | POA: Diagnosis not present

## 2021-10-13 DIAGNOSIS — E8881 Metabolic syndrome: Secondary | ICD-10-CM | POA: Diagnosis not present

## 2021-10-13 DIAGNOSIS — G4739 Other sleep apnea: Secondary | ICD-10-CM | POA: Diagnosis not present

## 2021-10-13 DIAGNOSIS — N951 Menopausal and female climacteric states: Secondary | ICD-10-CM | POA: Diagnosis not present

## 2021-11-04 ENCOUNTER — Other Ambulatory Visit (HOSPITAL_COMMUNITY): Payer: Self-pay

## 2021-11-21 ENCOUNTER — Other Ambulatory Visit (HOSPITAL_COMMUNITY): Payer: Self-pay

## 2021-11-21 DIAGNOSIS — Z01419 Encounter for gynecological examination (general) (routine) without abnormal findings: Secondary | ICD-10-CM | POA: Diagnosis not present

## 2021-11-21 DIAGNOSIS — N959 Unspecified menopausal and perimenopausal disorder: Secondary | ICD-10-CM | POA: Diagnosis not present

## 2021-11-21 DIAGNOSIS — Z13820 Encounter for screening for osteoporosis: Secondary | ICD-10-CM | POA: Diagnosis not present

## 2021-11-21 DIAGNOSIS — Z6827 Body mass index (BMI) 27.0-27.9, adult: Secondary | ICD-10-CM | POA: Diagnosis not present

## 2021-11-21 DIAGNOSIS — N952 Postmenopausal atrophic vaginitis: Secondary | ICD-10-CM | POA: Diagnosis not present

## 2021-11-21 DIAGNOSIS — Z1231 Encounter for screening mammogram for malignant neoplasm of breast: Secondary | ICD-10-CM | POA: Diagnosis not present

## 2021-11-21 MED ORDER — PROGESTERONE MICRONIZED 100 MG PO CAPS
100.0000 mg | ORAL_CAPSULE | Freq: Every evening | ORAL | 3 refills | Status: AC
  Filled 2021-11-21: qty 90, 90d supply, fill #0

## 2021-11-22 ENCOUNTER — Other Ambulatory Visit (HOSPITAL_COMMUNITY): Payer: Self-pay

## 2022-04-04 DIAGNOSIS — E039 Hypothyroidism, unspecified: Secondary | ICD-10-CM | POA: Diagnosis not present

## 2022-04-04 DIAGNOSIS — E785 Hyperlipidemia, unspecified: Secondary | ICD-10-CM | POA: Diagnosis not present

## 2022-04-04 DIAGNOSIS — Z7689 Persons encountering health services in other specified circumstances: Secondary | ICD-10-CM | POA: Diagnosis not present

## 2022-04-04 DIAGNOSIS — Z79890 Hormone replacement therapy (postmenopausal): Secondary | ICD-10-CM | POA: Diagnosis not present

## 2022-04-05 DIAGNOSIS — E785 Hyperlipidemia, unspecified: Secondary | ICD-10-CM | POA: Diagnosis not present

## 2022-04-05 DIAGNOSIS — E039 Hypothyroidism, unspecified: Secondary | ICD-10-CM | POA: Diagnosis not present

## 2022-04-05 DIAGNOSIS — Z79890 Hormone replacement therapy (postmenopausal): Secondary | ICD-10-CM | POA: Diagnosis not present

## 2022-04-06 ENCOUNTER — Other Ambulatory Visit (HOSPITAL_COMMUNITY): Payer: Self-pay

## 2022-04-06 ENCOUNTER — Other Ambulatory Visit: Payer: Self-pay

## 2022-04-06 MED ORDER — THYROID 120 MG PO TABS
120.0000 mg | ORAL_TABLET | Freq: Every day | ORAL | 0 refills | Status: AC
  Filled 2022-04-06: qty 90, 90d supply, fill #0

## 2022-04-06 MED ORDER — PROGESTERONE 200 MG PO CAPS
200.0000 mg | ORAL_CAPSULE | Freq: Every day | ORAL | 0 refills | Status: AC
  Filled 2022-04-06: qty 90, 90d supply, fill #0

## 2022-04-06 MED ORDER — ESTRADIOL 1 MG PO TABS
1.5000 mg | ORAL_TABLET | Freq: Every day | ORAL | 0 refills | Status: AC
Start: 2022-04-05 — End: ?
  Filled 2022-04-06: qty 135, 90d supply, fill #0

## 2022-04-09 ENCOUNTER — Other Ambulatory Visit: Payer: Self-pay

## 2022-04-09 ENCOUNTER — Other Ambulatory Visit (HOSPITAL_COMMUNITY): Payer: Self-pay

## 2022-04-10 ENCOUNTER — Other Ambulatory Visit (HOSPITAL_BASED_OUTPATIENT_CLINIC_OR_DEPARTMENT_OTHER): Payer: Self-pay

## 2022-04-10 ENCOUNTER — Other Ambulatory Visit: Payer: Self-pay

## 2022-04-12 ENCOUNTER — Other Ambulatory Visit (HOSPITAL_BASED_OUTPATIENT_CLINIC_OR_DEPARTMENT_OTHER): Payer: Self-pay

## 2022-04-12 ENCOUNTER — Other Ambulatory Visit: Payer: Self-pay

## 2023-01-30 ENCOUNTER — Other Ambulatory Visit (HOSPITAL_COMMUNITY): Payer: Self-pay

## 2023-01-30 DIAGNOSIS — Z1231 Encounter for screening mammogram for malignant neoplasm of breast: Secondary | ICD-10-CM | POA: Diagnosis not present

## 2023-01-30 DIAGNOSIS — Z6830 Body mass index (BMI) 30.0-30.9, adult: Secondary | ICD-10-CM | POA: Diagnosis not present

## 2023-01-30 DIAGNOSIS — N952 Postmenopausal atrophic vaginitis: Secondary | ICD-10-CM | POA: Diagnosis not present

## 2023-01-30 DIAGNOSIS — Z01419 Encounter for gynecological examination (general) (routine) without abnormal findings: Secondary | ICD-10-CM | POA: Diagnosis not present

## 2023-01-30 DIAGNOSIS — N959 Unspecified menopausal and perimenopausal disorder: Secondary | ICD-10-CM | POA: Diagnosis not present

## 2023-01-30 MED ORDER — PROGESTERONE 200 MG PO CAPS
200.0000 mg | ORAL_CAPSULE | Freq: Every day | ORAL | 3 refills | Status: AC
  Filled 2023-01-30 – 2023-06-03 (×2): qty 90, 90d supply, fill #0
  Filled 2024-01-28: qty 90, 90d supply, fill #1

## 2023-01-30 MED ORDER — ESTRADIOL 2 MG PO TABS
2.0000 mg | ORAL_TABLET | Freq: Every day | ORAL | 3 refills | Status: AC
  Filled 2023-01-30: qty 90, 90d supply, fill #0

## 2023-02-01 ENCOUNTER — Encounter: Payer: Self-pay | Admitting: Pharmacist

## 2023-02-01 ENCOUNTER — Other Ambulatory Visit: Payer: Self-pay

## 2023-02-05 ENCOUNTER — Other Ambulatory Visit: Payer: Self-pay

## 2023-02-06 ENCOUNTER — Other Ambulatory Visit: Payer: Self-pay | Admitting: Obstetrics and Gynecology

## 2023-02-06 DIAGNOSIS — R928 Other abnormal and inconclusive findings on diagnostic imaging of breast: Secondary | ICD-10-CM

## 2023-02-14 DIAGNOSIS — E8881 Metabolic syndrome: Secondary | ICD-10-CM | POA: Diagnosis not present

## 2023-02-14 DIAGNOSIS — E663 Overweight: Secondary | ICD-10-CM | POA: Diagnosis not present

## 2023-02-14 DIAGNOSIS — N951 Menopausal and female climacteric states: Secondary | ICD-10-CM | POA: Diagnosis not present

## 2023-02-14 DIAGNOSIS — E781 Pure hyperglyceridemia: Secondary | ICD-10-CM | POA: Diagnosis not present

## 2023-02-14 DIAGNOSIS — E039 Hypothyroidism, unspecified: Secondary | ICD-10-CM | POA: Diagnosis not present

## 2023-02-14 DIAGNOSIS — G4739 Other sleep apnea: Secondary | ICD-10-CM | POA: Diagnosis not present

## 2023-02-28 ENCOUNTER — Ambulatory Visit
Admission: RE | Admit: 2023-02-28 | Discharge: 2023-02-28 | Disposition: A | Discharge status: Home / Self Care | Payer: 59 | Source: Ambulatory Visit | Attending: Obstetrics and Gynecology | Admitting: Obstetrics and Gynecology

## 2023-02-28 DIAGNOSIS — R928 Other abnormal and inconclusive findings on diagnostic imaging of breast: Secondary | ICD-10-CM

## 2023-02-28 DIAGNOSIS — N6321 Unspecified lump in the left breast, upper outer quadrant: Secondary | ICD-10-CM | POA: Diagnosis not present

## 2023-03-30 ENCOUNTER — Other Ambulatory Visit (HOSPITAL_COMMUNITY): Payer: Self-pay

## 2023-03-30 MED ORDER — THYROID 120 MG PO TABS
120.0000 mg | ORAL_TABLET | ORAL | 0 refills | Status: AC
  Filled 2023-03-30: qty 90, 90d supply, fill #0

## 2023-03-30 MED ORDER — LIOTHYRONINE SODIUM 5 MCG PO TABS
5.0000 ug | ORAL_TABLET | Freq: Two times a day (BID) | ORAL | 2 refills | Status: DC
  Filled 2023-03-30: qty 60, 30d supply, fill #0
  Filled 2023-05-06: qty 60, 30d supply, fill #1
  Filled 2023-06-03: qty 60, 30d supply, fill #2

## 2023-04-01 ENCOUNTER — Other Ambulatory Visit (HOSPITAL_COMMUNITY): Payer: Self-pay

## 2023-04-01 ENCOUNTER — Other Ambulatory Visit: Payer: Self-pay

## 2023-04-02 ENCOUNTER — Other Ambulatory Visit (HOSPITAL_COMMUNITY): Payer: Self-pay

## 2023-05-06 ENCOUNTER — Other Ambulatory Visit (HOSPITAL_COMMUNITY): Payer: Self-pay

## 2023-06-04 ENCOUNTER — Other Ambulatory Visit: Payer: Self-pay

## 2023-06-04 ENCOUNTER — Other Ambulatory Visit (HOSPITAL_COMMUNITY): Payer: Self-pay

## 2023-07-18 ENCOUNTER — Other Ambulatory Visit (HOSPITAL_COMMUNITY): Payer: Self-pay

## 2023-07-19 ENCOUNTER — Other Ambulatory Visit: Payer: Self-pay

## 2023-07-19 ENCOUNTER — Other Ambulatory Visit (HOSPITAL_COMMUNITY): Payer: Self-pay

## 2023-07-19 MED ORDER — THYROID 120 MG PO TABS
120.0000 mg | ORAL_TABLET | Freq: Every morning | ORAL | 0 refills | Status: DC
  Filled 2023-07-19: qty 90, 90d supply, fill #0

## 2023-07-19 MED ORDER — LIOTHYRONINE SODIUM 5 MCG PO TABS
5.0000 ug | ORAL_TABLET | Freq: Two times a day (BID) | ORAL | 2 refills | Status: DC
  Filled 2023-07-19: qty 60, 30d supply, fill #0
  Filled 2023-08-21: qty 60, 30d supply, fill #1
  Filled 2023-09-26: qty 60, 30d supply, fill #2

## 2023-07-22 ENCOUNTER — Other Ambulatory Visit (HOSPITAL_COMMUNITY): Payer: Self-pay

## 2023-08-20 DIAGNOSIS — G4739 Other sleep apnea: Secondary | ICD-10-CM | POA: Diagnosis not present

## 2023-08-20 DIAGNOSIS — E781 Pure hyperglyceridemia: Secondary | ICD-10-CM | POA: Diagnosis not present

## 2023-08-20 DIAGNOSIS — E039 Hypothyroidism, unspecified: Secondary | ICD-10-CM | POA: Diagnosis not present

## 2023-08-20 DIAGNOSIS — E663 Overweight: Secondary | ICD-10-CM | POA: Diagnosis not present

## 2023-08-20 DIAGNOSIS — N951 Menopausal and female climacteric states: Secondary | ICD-10-CM | POA: Diagnosis not present

## 2023-08-20 DIAGNOSIS — E8881 Metabolic syndrome: Secondary | ICD-10-CM | POA: Diagnosis not present

## 2023-08-21 ENCOUNTER — Other Ambulatory Visit (HOSPITAL_COMMUNITY): Payer: Self-pay

## 2023-09-27 ENCOUNTER — Other Ambulatory Visit: Payer: Self-pay

## 2023-09-27 ENCOUNTER — Other Ambulatory Visit (HOSPITAL_COMMUNITY): Payer: Self-pay

## 2023-09-27 MED ORDER — LIOTHYRONINE SODIUM 5 MCG PO TABS
5.0000 ug | ORAL_TABLET | Freq: Two times a day (BID) | ORAL | 1 refills | Status: DC
  Filled 2023-10-29: qty 180, 90d supply, fill #0
  Filled 2024-01-28: qty 180, 90d supply, fill #1

## 2023-09-27 MED ORDER — ESTRADIOL 0.1 MG/24HR TD PTTW
1.0000 | MEDICATED_PATCH | TRANSDERMAL | 1 refills | Status: AC
  Filled 2023-09-27 – 2023-10-01 (×2): qty 24, 84d supply, fill #0
  Filled 2023-11-26 – 2023-12-26 (×3): qty 24, 84d supply, fill #1

## 2023-09-27 MED ORDER — THYROID 120 MG PO TABS
120.0000 mg | ORAL_TABLET | Freq: Every morning | ORAL | 1 refills | Status: DC
  Filled 2023-10-29: qty 90, 90d supply, fill #0
  Filled 2024-01-28: qty 5, 5d supply, fill #1
  Filled 2024-01-28: qty 90, 90d supply, fill #1
  Filled 2024-01-28: qty 85, 85d supply, fill #1

## 2023-09-27 MED ORDER — PROGESTERONE 200 MG PO CAPS
200.0000 mg | ORAL_CAPSULE | Freq: Every day | ORAL | 1 refills | Status: AC
  Filled 2023-09-27: qty 90, 90d supply, fill #0

## 2023-09-30 ENCOUNTER — Other Ambulatory Visit: Payer: Self-pay

## 2023-10-01 ENCOUNTER — Other Ambulatory Visit: Payer: Self-pay

## 2023-10-01 ENCOUNTER — Other Ambulatory Visit (HOSPITAL_COMMUNITY): Payer: Self-pay

## 2023-10-30 ENCOUNTER — Other Ambulatory Visit (HOSPITAL_COMMUNITY): Payer: Self-pay

## 2023-11-26 ENCOUNTER — Other Ambulatory Visit (HOSPITAL_COMMUNITY): Payer: Self-pay

## 2023-12-26 ENCOUNTER — Other Ambulatory Visit (HOSPITAL_COMMUNITY): Payer: Self-pay

## 2023-12-27 ENCOUNTER — Other Ambulatory Visit: Payer: Self-pay

## 2024-01-28 ENCOUNTER — Other Ambulatory Visit (HOSPITAL_COMMUNITY): Payer: Self-pay

## 2024-01-28 ENCOUNTER — Other Ambulatory Visit: Payer: Self-pay

## 2024-01-29 ENCOUNTER — Other Ambulatory Visit (HOSPITAL_COMMUNITY): Payer: Self-pay

## 2024-01-29 ENCOUNTER — Other Ambulatory Visit: Payer: Self-pay

## 2024-02-04 DIAGNOSIS — Z1231 Encounter for screening mammogram for malignant neoplasm of breast: Secondary | ICD-10-CM | POA: Diagnosis not present

## 2024-02-19 DIAGNOSIS — N951 Menopausal and female climacteric states: Secondary | ICD-10-CM | POA: Diagnosis not present

## 2024-02-19 DIAGNOSIS — Z01419 Encounter for gynecological examination (general) (routine) without abnormal findings: Secondary | ICD-10-CM | POA: Diagnosis not present

## 2024-02-19 DIAGNOSIS — Z6830 Body mass index (BMI) 30.0-30.9, adult: Secondary | ICD-10-CM | POA: Diagnosis not present

## 2024-02-19 DIAGNOSIS — Z1272 Encounter for screening for malignant neoplasm of vagina: Secondary | ICD-10-CM | POA: Diagnosis not present

## 2024-02-20 ENCOUNTER — Other Ambulatory Visit: Payer: Self-pay

## 2024-02-20 ENCOUNTER — Other Ambulatory Visit (HOSPITAL_COMMUNITY): Payer: Self-pay

## 2024-02-20 MED ORDER — PROGESTERONE 200 MG PO CAPS: 200.0000 mg | ORAL_CAPSULE | Freq: Every day | ORAL | 3 refills | Status: AC

## 2024-02-20 MED ORDER — ESTRADIOL 2 MG PO TABS
2.0000 mg | ORAL_TABLET | Freq: Every day | ORAL | 3 refills | Status: AC
  Filled 2024-02-20: qty 90, 90d supply, fill #0

## 2024-03-16 ENCOUNTER — Other Ambulatory Visit (HOSPITAL_COMMUNITY): Payer: Self-pay

## 2024-03-16 ENCOUNTER — Other Ambulatory Visit: Payer: Self-pay

## 2024-03-17 ENCOUNTER — Other Ambulatory Visit (HOSPITAL_COMMUNITY): Payer: Self-pay

## 2024-03-17 ENCOUNTER — Encounter (HOSPITAL_COMMUNITY): Payer: Self-pay

## 2024-03-17 MED ORDER — LIOTHYRONINE SODIUM 5 MCG PO TABS
15.0000 ug | ORAL_TABLET | Freq: Every day | ORAL | 1 refills | Status: AC
  Filled 2024-03-17: qty 270, 90d supply, fill #0

## 2024-03-17 MED ORDER — ESTRADIOL 0.1 MG/24HR TD PTTW
1.0000 | MEDICATED_PATCH | TRANSDERMAL | 1 refills | Status: AC
  Filled 2024-03-17 – 2024-03-19 (×2): qty 24, 84d supply, fill #0

## 2024-03-17 MED ORDER — PROGESTERONE MICRONIZED 100 MG PO CAPS
100.0000 mg | ORAL_CAPSULE | Freq: Every day | ORAL | 2 refills | Status: AC
  Filled 2024-03-17: qty 60, 30d supply, fill #0

## 2024-03-17 MED ORDER — PROGESTERONE 200 MG PO CAPS
200.0000 mg | ORAL_CAPSULE | Freq: Every day | ORAL | 1 refills | Status: AC
  Filled 2024-03-17: qty 90, 90d supply, fill #0

## 2024-03-17 MED ORDER — THYROID 120 MG PO TABS
120.0000 mg | ORAL_TABLET | Freq: Every morning | ORAL | 1 refills | Status: AC
  Filled 2024-03-17: qty 90, 90d supply, fill #0

## 2024-03-19 ENCOUNTER — Other Ambulatory Visit (HOSPITAL_COMMUNITY): Payer: Self-pay
